# Patient Record
Sex: Male | Born: 2000 | Race: Black or African American | Hispanic: No | Smoking: Never smoker
Health system: Southern US, Community
[De-identification: ages and names within clinical notes are randomized; demographics above are authoritative.]

## PROBLEM LIST (undated history)

## (undated) HISTORY — PX: LEG AMPUTATION BELOW KNEE: SHX694

---
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
## 2020-08-05 ENCOUNTER — Other Ambulatory Visit: Payer: Medicaid Other

## 2020-08-05 ENCOUNTER — Other Ambulatory Visit: Payer: Self-pay

## 2020-08-05 ENCOUNTER — Encounter (HOSPITAL_BASED_OUTPATIENT_CLINIC_OR_DEPARTMENT_OTHER): Payer: Self-pay | Admitting: *Deleted

## 2020-08-05 ENCOUNTER — Emergency Department (HOSPITAL_BASED_OUTPATIENT_CLINIC_OR_DEPARTMENT_OTHER)
Admission: EM | Admit: 2020-08-05 | Discharge: 2020-08-05 | Disposition: A | Discharge status: Home / Self Care | Payer: Medicaid Other | Attending: Emergency Medicine | Admitting: Emergency Medicine

## 2020-08-05 DIAGNOSIS — Z91040 Latex allergy status: Secondary | ICD-10-CM | POA: Insufficient documentation

## 2020-08-05 DIAGNOSIS — J029 Acute pharyngitis, unspecified: Secondary | ICD-10-CM | POA: Diagnosis present

## 2020-08-05 DIAGNOSIS — Z20822 Contact with and (suspected) exposure to covid-19: Secondary | ICD-10-CM | POA: Diagnosis not present

## 2020-08-05 LAB — GROUP A STREP BY PCR: Group A Strep by PCR: NOT DETECTED

## 2020-08-05 MED ORDER — ONDANSETRON 4 MG PO TBDP: 4.0000 mg | ORAL_TABLET | Freq: Three times a day (TID) | ORAL | 0 refills | Status: DC | PRN

## 2020-08-05 MED ORDER — BENZONATATE 100 MG PO CAPS: 100.0000 mg | ORAL_CAPSULE | Freq: Three times a day (TID) | ORAL | 0 refills | Status: DC

## 2020-08-05 MED ORDER — FLUTICASONE PROPIONATE 50 MCG/ACT NA SUSP: 2.0000 | Freq: Every day | NASAL | 0 refills | Status: DC

## 2020-08-05 MED ORDER — PROMETHAZINE-DM 6.25-15 MG/5ML PO SYRP: 5.0000 mL | ORAL_SOLUTION | Freq: Four times a day (QID) | ORAL | 0 refills | Status: DC | PRN

## 2020-08-05 NOTE — ED Notes (Signed)
ED Provider at bedside. 

## 2020-08-05 NOTE — Discharge Instructions (Signed)
Your COVID test is pending; you should expect results within 24 hours. You can access your results on your MyChart--if you test positive you should receive a phone call.  In the meantime follow CDC guidelines and quarantine, wear a mask, wash hands often.   Please use Tylenol or ibuprofen for pain.  You may use 600 mg ibuprofen every 6 hours or 1000 mg of Tylenol every 6 hours.  You may choose to alternate between the 2.  This would be most effective.  Not to exceed 4 g of Tylenol within 24 hours.  Not to exceed 3200 mg ibuprofen 24 hours.   Please take over the counter vitamin D 2000-4000 units per day. I also recommend zinc 50 mg per day for the next two weeks.   Please return to ED if you feel have difficulty breathing or have emergent, new or concerning symptoms.  Patients who have symptoms consistent with COVID-19 should self isolated for: At least 3 days (72 hours) have passed since recovery, defined as resolution of fever without the use of fever reducing medications and improvement in respiratory symptoms (e.g., cough, shortness of breath), and At least 7 days have passed since symptoms first appeared.       Person Under Monitoring Name: William Koch  Location: [address] [place] [address]   Infection Prevention Recommendations for Individuals Confirmed to have, or Being Evaluated for, 2019 Novel Coronavirus (COVID-19) Infection Who Receive Care at Home  Individuals who are confirmed to have, or are being evaluated for, COVID-19 should follow the prevention steps below until a healthcare provider or local or state health department says they can return to normal activities.  Stay home except to get medical care You should restrict activities outside your home, except for getting medical care. Do not go to work, school, or public areas, and do not use public transportation or taxis.  Call ahead before visiting your doctor Before your medical appointment,  call the healthcare provider and tell them that you have, or are being evaluated for, COVID-19 infection. This will help the healthcare providers office take steps to keep other people from getting infected. Ask your healthcare provider to call the local or state health department.  Monitor your symptoms Seek prompt medical attention if your illness is worsening (e.g., difficulty breathing). Before going to your medical appointment, call the healthcare provider and tell them that you have, or are being evaluated for, COVID-19 infection. Ask your healthcare provider to call the local or state health department.  Wear a facemask You should wear a facemask that covers your nose and mouth when you are in the same room with other people and when you visit a healthcare provider. People who live with or visit you should also wear a facemask while they are in the same room with you.  Separate yourself from other people in your home As much as possible, you should stay in a different room from other people in your home. Also, you should use a separate bathroom, if available.  Avoid sharing household items You should not share dishes, drinking glasses, cups, eating utensils, towels, bedding, or other items with other people in your home. After using these items, you should wash them thoroughly with soap and water.  Cover your coughs and sneezes Cover your mouth and nose with a tissue when you cough or sneeze, or you can cough or sneeze into your sleeve. Throw used tissues in a lined trash can, and immediately wash your  hands with soap and water for at least 20 seconds or use an alcohol-based hand rub.  Wash your [employer] your hands often and thoroughly with soap and water for at least 20 seconds. You can use an alcohol-based hand sanitizer if soap and water are not available and if your hands are not visibly dirty. Avoid touching your eyes, nose, and mouth with unwashed hands.   Prevention  Steps for Caregivers and Household Members of Individuals Confirmed to have, or Being Evaluated for, COVID-19 Infection Being Cared for in the Home  If you live with, or provide care at home for, a person confirmed to have, or being evaluated for, COVID-19 infection please follow these guidelines to prevent infection:  Follow healthcare providers instructions Make sure that you understand and can help the patient follow any healthcare provider instructions for all care.  Provide for the patients basic needs You should help the patient with basic needs in the home and provide support for getting groceries, prescriptions, and other personal needs.  Monitor the patients symptoms If they are getting sicker, call his or her medical provider and tell them that the patient has, or is being evaluated for, COVID-19 infection. This will help the healthcare providers office take steps to keep other people from getting infected. Ask the healthcare provider to call the local or state health department.  Limit the number of people who have contact with the patient If possible, have only one caregiver for the patient. Other household members should stay in another home or place of residence. If this is not possible, they should stay in another room, or be separated from the patient as much as possible. Use a separate bathroom, if available. Restrict visitors who do not have an essential need to be in the home.  Keep older adults, very young children, and other sick people away from the patient Keep older adults, very young children, and those who have compromised immune systems or chronic health conditions away from the patient. This includes people with chronic heart, lung, or kidney conditions, diabetes, and cancer.  Ensure good ventilation Make sure that shared spaces in the home have good air flow, such as from an air conditioner or an opened window, weather permitting.  Wash your hands  often Wash your hands often and thoroughly with soap and water for at least 20 seconds. You can use an alcohol based hand sanitizer if soap and water are not available and if your hands are not visibly dirty. Avoid touching your eyes, nose, and mouth with unwashed hands. Use disposable paper towels to dry your hands. If not available, use dedicated cloth towels and replace them when they become wet.  Wear a facemask and gloves Wear a disposable facemask at all times in the room and gloves when you touch or have contact with the patients blood, body fluids, and/or secretions or excretions, such as sweat, saliva, sputum, nasal mucus, vomit, urine, or feces.  Ensure the mask fits over your nose and mouth tightly, and do not touch it during use. Throw out disposable facemasks and gloves after using them. Do not reuse. Wash your hands immediately after removing your facemask and gloves. If your personal clothing becomes contaminated, carefully remove clothing and launder. Wash your hands after handling contaminated clothing. Place all used disposable facemasks, gloves, and other waste in a lined container before disposing them with other household waste. Remove gloves and wash your hands immediately after handling these items.  Do not share dishes, glasses,  or other household items with the patient Avoid sharing household items. You should not share dishes, drinking glasses, cups, eating utensils, towels, bedding, or other items with a patient who is confirmed to have, or being evaluated for, COVID-19 infection. After the person uses these items, you should wash them thoroughly with soap and water.  Wash laundry thoroughly Immediately remove and wash clothes or bedding that have blood, body fluids, and/or secretions or excretions, such as sweat, saliva, sputum, nasal mucus, vomit, urine, or feces, on them. Wear gloves when handling laundry from the patient. Read and follow directions on labels of  laundry or clothing items and detergent. In general, wash and dry with the warmest temperatures recommended on the label.  Clean all areas the individual has used often Clean all touchable surfaces, such as counters, tabletops, doorknobs, bathroom fixtures, toilets, phones, keyboards, tablets, and bedside tables, every day. Also, clean any surfaces that may have blood, body fluids, and/or secretions or excretions on them. Wear gloves when cleaning surfaces the patient has come in contact with. Use a diluted bleach solution (e.g., dilute bleach with 1 part bleach and 10 parts water) or a household disinfectant with a label that says EPA-registered for coronaviruses. To make a bleach solution at home, add 1 tablespoon of bleach to 1 quart (4 cups) of water. For a larger supply, add  cup of bleach to 1 gallon (16 cups) of water. Read labels of cleaning products and follow recommendations provided on product labels. Labels contain instructions for safe and effective use of the cleaning product including precautions you should take when applying the product, such as wearing gloves or eye protection and making sure you have good ventilation during use of the product. Remove gloves and wash hands immediately after cleaning.  Monitor yourself for signs and symptoms of illness Caregivers and household members are considered close contacts, should monitor their health, and will be asked to limit movement outside of the home to the extent possible. Follow the monitoring steps for close contacts listed on the symptom monitoring form.   ? If you have additional questions, contact your local health department or call the epidemiologist on call at [phone] (available 24/7). ? This guidance is subject to change. For the most up-to-date guidance from [facility], please refer to their website: [employer]hu

## 2020-08-05 NOTE — ED Provider Notes (Signed)
[facility] EMERGENCY DEPARTMENT Provider Note   CSN: [id number] Arrival date & time: 08/05/20  1250     History Chief Complaint  Patient presents with  . Sore Throat    William Koch is a 20 y.o. male.  HPI  Patient is a 20 year old male presenting today with sore throat he states this is been ongoing for 1 week.  He states that he has a family member with strep throat.  He states he also has associated cough and congestion.  He denies any fevers or chills no other associated symptoms.  No aggravating symptoms.  He states he is otherwise feeling well.  No nausea vomiting chest pain or lightheadedness or dizziness.  No other associated symptoms.  He has taken no medications prior to arrival for symptoms.      History reviewed. No pertinent past medical history.  There are no problems to display for this patient.   Past Surgical History:  Procedure Laterality Date  . LEG AMPUTATION BELOW KNEE         Family History  Problem Relation Age of Onset  . Diabetes Father   . Hypertension Father   . Cancer Father   . Stroke Other     Social History   Tobacco Use  . Smoking status: Never Smoker  Substance Use Topics  . Alcohol use: No  . Drug use: No    Home Medications Prior to Admission medications   Medication Sig Start Date End Date Taking? Authorizing Provider  benzonatate (TESSALON) 100 MG capsule Take 1 capsule (100 mg total) by mouth every 8 (eight) hours. 08/05/20  Yes Chantale Leugers S, PA  fluticasone (FLONASE) 50 MCG/ACT nasal spray Place 2 sprays into both nostrils daily for 14 days. 08/05/20 08/19/20 Yes Latresa Gasser S, PA  ondansetron (ZOFRAN ODT) 4 MG disintegrating tablet Take 1 tablet (4 mg total) by mouth every 8 (eight) hours as needed for nausea or vomiting. 08/05/20  Yes Najeh Credit S, PA  promethazine-dextromethorphan (PROMETHAZINE-DM) 6.25-15 MG/5ML syrup Take 5 mLs by mouth 4 (four) times daily as needed for cough. 08/05/20  Yes  Fleta Borgeson, Stevphen Meuse S, PA  cefdinir (OMNICEF) 300 MG capsule Take 300 mg by mouth 2 (two) times daily. 10 day therapy course 04/28/15   [provider]    Allergies    Latex  Review of Systems   Review of Systems  Constitutional: Positive for fatigue. Negative for chills and fever.  HENT: Positive for congestion, postnasal drip, rhinorrhea and sore throat.   Eyes: Negative for pain.  Respiratory: Negative for cough and shortness of breath.   Cardiovascular: Negative for chest pain and leg swelling.  Gastrointestinal: Negative for abdominal pain, nausea and vomiting.  Genitourinary: Negative for dysuria.  Musculoskeletal: Negative for myalgias.  Skin: Negative for rash.  Neurological: Negative for dizziness and headaches.    Physical Exam Updated Vital Signs BP (!) 146/88 (BP Location: Right Arm)   Pulse 72   Temp 98.4 F (36.9 C) (Oral)   Resp 18   Ht 5\' 8"  (1.727 m)   Wt (!) 137.4 kg   SpO2 100%   BMI 46.07 kg/m   Physical Exam Vitals and nursing note reviewed.  Constitutional:      General: He is not in acute distress.    Appearance: He is obese.  HENT:     Head: Normocephalic and atraumatic.     Nose: Nose normal.     Mouth/Throat:     Comments: Posterior pharynx with cobblestoning  no tonsillar exudates or tonsillar hypertrophy.  Uvula is midline.  Normal phonation. Eyes:     General: No scleral icterus. Neck:     Comments: Mild bilateral anterior cervical chain lymphadenopathy.  Trachea midline. Cardiovascular:     Rate and Rhythm: Normal rate and regular rhythm.     Pulses: Normal pulses.     Heart sounds: Normal heart sounds.  Pulmonary:     Effort: Pulmonary effort is normal. No respiratory distress.     Breath sounds: No wheezing.  Abdominal:     Palpations: Abdomen is soft.     Tenderness: There is no abdominal tenderness.  Musculoskeletal:     Cervical back: Normal range of motion.     Right lower leg: No edema.     Left lower leg: No edema.   Skin:    General: Skin is warm and dry.     Capillary Refill: Capillary refill takes less than 2 seconds.  Neurological:     Mental Status: He is alert. Mental status is at baseline.  Psychiatric:        Mood and Affect: Mood normal.        Behavior: Behavior normal.     ED Results / Procedures / Treatments   Labs (all labs ordered are listed, but only abnormal results are displayed) Labs Reviewed  GROUP A STREP BY PCR    EKG None  Radiology No results found.  Procedures Procedures (including critical care time)  Medications Ordered in ED Medications - No data to display  ED Course  I have reviewed the triage vital signs and the nursing notes.  Pertinent labs & imaging results that were available during my care of the patient were reviewed by me and considered in my medical decision making (see chart for details).    MDM Rules/Calculators/A&P                          Patient is 20 year old male with past medical history detailed above presented today for sore throat for 1 week.  Strep test was negative.  I have low suspicion for bacterial pharyngitis.  Suspect viral pharyngitis.  With his other symptoms I have high suspicion for COVID-19.  He does have mildly elevated blood pressure no other significant abnormalities of his vital signs.  No shortness of breath or chest pain.  We will treat patient with Tylenol ibuprofen as well as cough medicine Flonase and Zofran.  I suspect that his postnasal drainage is what is causing his pharyngitis.  Given return precautions.  William Koch was evaluated in Emergency Department on 08/05/2020 for the symptoms described in the history of present illness. He was evaluated in the context of the global COVID-19 pandemic, which necessitated consideration that the patient might be at risk for infection with the SARS-CoV-2 virus that causes COVID-19. Institutional protocols and algorithms that pertain to the evaluation of patients at risk  for COVID-19 are in a state of rapid change based on information released by regulatory bodies including the CDC and federal and state organizations. These policies and algorithms were followed during the patient's care in the ED.  Final Clinical Impression(s) / ED Diagnoses Final diagnoses:  Pharyngitis, unspecified etiology  Contact with and (suspected) exposure to covid-19    Rx / DC Orders ED Discharge Orders         Ordered    benzonatate (TESSALON) 100 MG capsule  Every 8 hours  08/05/20 1752    fluticasone (FLONASE) 50 MCG/ACT nasal spray  Daily        08/05/20 1752    promethazine-dextromethorphan (PROMETHAZINE-DM) 6.25-15 MG/5ML syrup  4 times daily PRN        08/05/20 1752    ondansetron (ZOFRAN ODT) 4 MG disintegrating tablet  Every 8 hours PRN        08/05/20 1752           Gailen Shelter, [place] 08/05/20 1831    Tilden Fossa, MD 08/05/20 2114

## 2020-08-05 NOTE — ED Triage Notes (Signed)
C/o sore throat x 1 week , family at home with strep

## 2020-08-05 NOTE — ED Notes (Signed)
PA notified of B/P .  Recheck b/p 144/90

## 2020-08-08 LAB — NOVEL CORONAVIRUS, NAA: SARS-CoV-2, NAA: DETECTED — AB

## 2021-07-22 ENCOUNTER — Other Ambulatory Visit: Payer: Self-pay

## 2021-07-22 ENCOUNTER — Observation Stay (HOSPITAL_COMMUNITY)
Admission: EM | Admit: 2021-07-22 | Discharge: 2021-07-23 | Disposition: A | Discharge status: Home / Self Care | Payer: Medicaid Other | Attending: Family Medicine | Admitting: Family Medicine

## 2021-07-22 ENCOUNTER — Emergency Department (HOSPITAL_COMMUNITY)
Admission: EM | Admit: 2021-07-22 | Discharge: 2021-07-22 | Disposition: A | Discharge status: Home / Self Care | Payer: Medicaid Other | Source: Home / Self Care | Attending: Emergency Medicine | Admitting: Emergency Medicine

## 2021-07-22 ENCOUNTER — Encounter (HOSPITAL_COMMUNITY): Payer: Self-pay | Admitting: Emergency Medicine

## 2021-07-22 ENCOUNTER — Emergency Department (HOSPITAL_COMMUNITY): Payer: Medicaid Other

## 2021-07-22 DIAGNOSIS — Z20822 Contact with and (suspected) exposure to covid-19: Secondary | ICD-10-CM | POA: Insufficient documentation

## 2021-07-22 DIAGNOSIS — R569 Unspecified convulsions: Principal | ICD-10-CM | POA: Insufficient documentation

## 2021-07-22 DIAGNOSIS — Z91040 Latex allergy status: Secondary | ICD-10-CM | POA: Insufficient documentation

## 2021-07-22 DIAGNOSIS — Z79899 Other long term (current) drug therapy: Secondary | ICD-10-CM | POA: Insufficient documentation

## 2021-07-22 DIAGNOSIS — R6510 Systemic inflammatory response syndrome (SIRS) of non-infectious origin without acute organ dysfunction: Secondary | ICD-10-CM | POA: Insufficient documentation

## 2021-07-22 DIAGNOSIS — E669 Obesity, unspecified: Secondary | ICD-10-CM | POA: Diagnosis not present

## 2021-07-22 DIAGNOSIS — E872 Acidosis, unspecified: Secondary | ICD-10-CM | POA: Insufficient documentation

## 2021-07-22 DIAGNOSIS — Y900 Blood alcohol level of less than 20 mg/100 ml: Secondary | ICD-10-CM | POA: Diagnosis not present

## 2021-07-22 LAB — URINALYSIS, ROUTINE W REFLEX MICROSCOPIC
Bilirubin Urine: NEGATIVE
Glucose, UA: NEGATIVE mg/dL
Hgb urine dipstick: NEGATIVE
Ketones, ur: NEGATIVE mg/dL
Leukocytes,Ua: NEGATIVE
Nitrite: NEGATIVE
Protein, ur: NEGATIVE mg/dL
Specific Gravity, Urine: 1.013 (ref 1.005–1.030)
pH: 6 (ref 5.0–8.0)

## 2021-07-22 LAB — RAPID URINE DRUG SCREEN, HOSP PERFORMED
Amphetamines: NOT DETECTED
Barbiturates: NOT DETECTED
Benzodiazepines: NOT DETECTED
Cocaine: NOT DETECTED
Opiates: NOT DETECTED
Tetrahydrocannabinol: POSITIVE — AB

## 2021-07-22 LAB — CBC
HCT: 45.2 % (ref 39.0–52.0)
Hemoglobin: 14.5 g/dL (ref 13.0–17.0)
MCH: 29.4 pg (ref 26.0–34.0)
MCHC: 32.1 g/dL (ref 30.0–36.0)
MCV: 91.7 fL (ref 80.0–100.0)
Platelets: 381 10*3/uL (ref 150–400)
RBC: 4.93 MIL/uL (ref 4.22–5.81)
RDW: 13.2 % (ref 11.5–15.5)
WBC: 11.9 10*3/uL — ABNORMAL HIGH (ref 4.0–10.5)
nRBC: 0 % (ref 0.0–0.2)

## 2021-07-22 LAB — CBC WITH DIFFERENTIAL/PLATELET
Abs Immature Granulocytes: 0.19 10*3/uL — ABNORMAL HIGH (ref 0.00–0.07)
Basophils Absolute: 0.1 10*3/uL (ref 0.0–0.1)
Basophils Relative: 0 %
Eosinophils Absolute: 0 10*3/uL (ref 0.0–0.5)
Eosinophils Relative: 0 %
HCT: 48.8 % (ref 39.0–52.0)
Hemoglobin: 15.3 g/dL (ref 13.0–17.0)
Immature Granulocytes: 1 %
Lymphocytes Relative: 16 %
Lymphs Abs: 4.9 10*3/uL — ABNORMAL HIGH (ref 0.7–4.0)
MCH: 29.9 pg (ref 26.0–34.0)
MCHC: 31.4 g/dL (ref 30.0–36.0)
MCV: 95.3 fL (ref 80.0–100.0)
Monocytes Absolute: 1.9 10*3/uL — ABNORMAL HIGH (ref 0.1–1.0)
Monocytes Relative: 7 %
Neutro Abs: 22.8 10*3/uL — ABNORMAL HIGH (ref 1.7–7.7)
Neutrophils Relative %: 76 %
Platelets: 466 10*3/uL — ABNORMAL HIGH (ref 150–400)
RBC: 5.12 MIL/uL (ref 4.22–5.81)
RDW: 13.2 % (ref 11.5–15.5)
WBC: 29.9 10*3/uL — ABNORMAL HIGH (ref 4.0–10.5)
nRBC: 0 % (ref 0.0–0.2)

## 2021-07-22 LAB — BASIC METABOLIC PANEL
Anion gap: 10 (ref 5–15)
Anion gap: 22 — ABNORMAL HIGH (ref 5–15)
BUN: 12 mg/dL (ref 6–20)
BUN: 11 mg/dL (ref 6–20)
CO2: 22 mmol/L (ref 22–32)
CO2: 11 mmol/L — ABNORMAL LOW (ref 22–32)
Calcium: 9.2 mg/dL (ref 8.9–10.3)
Calcium: 9.6 mg/dL (ref 8.9–10.3)
Chloride: 106 mmol/L (ref 98–111)
Chloride: 109 mmol/L (ref 98–111)
Creatinine, Ser: 0.95 mg/dL (ref 0.61–1.24)
Creatinine, Ser: 1.18 mg/dL (ref 0.61–1.24)
GFR, Estimated: >60 mL/min (ref >60)
GFR, Estimated: >60 mL/min (ref >60)
Glucose, Bld: 114 mg/dL — ABNORMAL HIGH (ref 70–99)
Glucose, Bld: 117 mg/dL — ABNORMAL HIGH (ref 70–99)
Potassium: 4.8 mmol/L (ref 3.5–5.1)
Potassium: 4.5 mmol/L (ref 3.5–5.1)
Sodium: 138 mmol/L (ref 135–145)
Sodium: 142 mmol/L (ref 135–145)

## 2021-07-22 LAB — CBG MONITORING, ED
Glucose-Capillary: 111 mg/dL — ABNORMAL HIGH (ref 70–99)
Glucose-Capillary: 84 mg/dL (ref 70–99)

## 2021-07-22 LAB — RESP PANEL BY RT-PCR (FLU A&B, COVID) ARPGX2
Influenza A by PCR: NEGATIVE
Influenza B by PCR: NEGATIVE
SARS Coronavirus 2 by RT PCR: NEGATIVE

## 2021-07-22 LAB — ETHANOL: Alcohol, Ethyl (B): <10 mg/dL (ref <10)

## 2021-07-22 MED ORDER — LEVETIRACETAM IN NACL 1000 MG/100ML IV SOLN
1000.0000 mg | INTRAVENOUS | Status: AC
  Administered 2021-07-22 (×2): 1000 mg via INTRAVENOUS
  Filled 2021-07-22 (×2): qty 100

## 2021-07-22 MED ORDER — LORAZEPAM 2 MG/ML IJ SOLN
1.0000 mg | Freq: Once | INTRAMUSCULAR | Status: AC
  Administered 2021-07-22: 23:00:00 1 mg via INTRAVENOUS
  Filled 2021-07-22: qty 1

## 2021-07-22 MED ORDER — SODIUM CHLORIDE 0.9 % IV SOLN: 2000.0000 mg | Freq: Once | INTRAVENOUS | Status: DC

## 2021-07-22 NOTE — ED Provider Notes (Signed)
Emergency Medicine Provider Triage Evaluation Note  William Koch , a 20 y.o. male  was evaluated in triage.  Pt complains of seizure onset prior to arrival.  Patient had an MSE and full evaluation today for similar concerns with negative work-up.  Patient notes that he went to lay in the tub and when he woke up EMS and his girlfriend were around him.  He has associated headache.  He tried Tylenol for his symptoms.  Denies chest pain, shortness of breath, abdominal pain, nausea, vomiting.  Review of Systems  Positive: Headache Negative: Chest pain, shortness of breath  Physical Exam  BP 118/73    Pulse 87    Temp 99 F (37.2 C)    Resp 16    SpO2 99%  Gen:   Awake, no distress   Resp:  Normal effort MSK:   Moves extremities without difficulty Other:  No abdominal tenderness to palpation.  No chest wall tenderness to palpation. EOMI. PERRL.  Medical Decision Making  Medically screening exam initiated at 8:43 PM.  Appropriate orders placed.  Prabhjot Piscitello was informed that the remainder of the evaluation will be completed by another provider, this initial triage assessment does not replace that evaluation, and the importance of remaining in the ED until their evaluation is complete.   Donnalynn Wheeless A, PA-C 07/22/21 2047    Gilda Crease, MD 07/22/21 2222

## 2021-07-22 NOTE — ED Provider Notes (Signed)
[facility] EMERGENCY DEPARTMENT Provider Note   CSN: [id number] Arrival date & time: 07/22/21  1245     History Chief Complaint  Patient presents with   Seizures    William Koch is a 20 y.o. male who presents emergency department for seizure prior to arrival.  Seizure was witnessed by girlfriend, who states that patient woke up and had full body convulsions for about 3 minutes.  She notes that for several minutes after that, she had difficulty waking up the patient.  Once EMS arrived the patient appeared to be confused.  He states that his tongue is sore, and he thinks that he bit it during the seizure.  He does not remember the events prior to the onset of the seizure.  He states he has been feeling well the past couple days, but does feel dehydrated at the moment.  He denies any history of seizures.   Seizures     History reviewed. No pertinent past medical history.  There are no problems to display for this patient.   Past Surgical History:  Procedure Laterality Date   LEG AMPUTATION BELOW KNEE         Family History  Problem Relation Age of Onset   Diabetes Father    Hypertension Father    Cancer Father    Stroke Other     Social History   Tobacco Use   Smoking status: Never  Substance Use Topics   Alcohol use: No   Drug use: No    Home Medications Prior to Admission medications   Medication Sig Start Date End Date Taking? Authorizing Provider  benzonatate (TESSALON) 100 MG capsule Take 1 capsule (100 mg total) by mouth every 8 (eight) hours. Patient not taking: Reported on 07/22/2021 08/05/20   Gailen Shelter, PA  cefdinir (OMNICEF) 300 MG capsule Take 300 mg by mouth 2 (two) times daily. 10 day therapy course Patient not taking: Reported on 07/22/2021 04/28/15   [provider]  fluticasone (FLONASE) 50 MCG/ACT nasal spray Place 2 sprays into both nostrils daily for 14 days. Patient not taking: Reported on 07/22/2021 08/05/20  07/22/21  Gailen Shelter, PA  ondansetron (ZOFRAN ODT) 4 MG disintegrating tablet Take 1 tablet (4 mg total) by mouth every 8 (eight) hours as needed for nausea or vomiting. Patient not taking: Reported on 07/22/2021 08/05/20   Gailen Shelter, PA  promethazine-dextromethorphan (PROMETHAZINE-DM) 6.25-15 MG/5ML syrup Take 5 mLs by mouth 4 (four) times daily as needed for cough. Patient not taking: Reported on 07/22/2021 08/05/20   Gailen Shelter, PA    Allergies    Latex  Review of Systems   Review of Systems  Constitutional:  Negative for chills and fever.  HENT:  Negative for congestion.   Respiratory:  Negative for cough and shortness of breath.   Cardiovascular:  Negative for chest pain.  Gastrointestinal:  Negative for abdominal pain, constipation, diarrhea, nausea and vomiting.  Neurological:  Positive for seizures and headaches. Negative for dizziness, speech difficulty, weakness, light-headedness and numbness.  All other systems reviewed and are negative.  Physical Exam Updated Vital Signs BP (!) 144/77    Pulse 83    Temp 98.8 F (37.1 C) (Oral)    Resp 15    SpO2 100%   Physical Exam Vitals and nursing note reviewed.  Constitutional:      Appearance: Normal appearance.  HENT:     Head: Normocephalic and atraumatic.  Eyes:     Conjunctiva/sclera:  Conjunctivae normal.  Cardiovascular:     Rate and Rhythm: Normal rate and regular rhythm.  Pulmonary:     Effort: Pulmonary effort is normal. No respiratory distress.     Breath sounds: Normal breath sounds.  Abdominal:     General: There is no distension.     Palpations: Abdomen is soft.     Tenderness: There is no abdominal tenderness.  Musculoskeletal:     Comments: S/p right BKA since childhood  Skin:    General: Skin is warm and dry.  Neurological:     General: No focal deficit present.     Mental Status: He is alert.     Comments: Neuro: Speech is clear, able to follow commands. CN III-XII intact grossly  intact. PERRLA. EOMI. Sensation intact throughout. Str 5/5 all extremities.     ED Results / Procedures / Treatments   Labs (all labs ordered are listed, but only abnormal results are displayed) Labs Reviewed  BASIC METABOLIC PANEL - Abnormal; Notable for the following components:      Result Value   Glucose, Bld 114 (*)    All other components within normal limits  CBC - Abnormal; Notable for the following components:   WBC 11.9 (*)    All other components within normal limits  URINALYSIS, ROUTINE W REFLEX MICROSCOPIC - Abnormal; Notable for the following components:   Color, Urine STRAW (*)    All other components within normal limits  RAPID URINE DRUG SCREEN, HOSP PERFORMED - Abnormal; Notable for the following components:   Tetrahydrocannabinol POSITIVE (*)    All other components within normal limits  CBG MONITORING, ED - Abnormal; Notable for the following components:   Glucose-Capillary 111 (*)    All other components within normal limits  RESP PANEL BY RT-PCR (FLU A&B, COVID) [id number]    EKG EKG Interpretation  Date/Time:  Thursday July 22 2021 12:45:57 EST Ventricular Rate:  110 PR Interval:  138 QRS Duration: 96 QT Interval:  326 QTC Calculation: 441 R Axis:   47 Text Interpretation: Sinus tachycardia Otherwise normal ECG When compared with ECG of 12-May-2015 22:39, PREVIOUS ECG IS PRESENT Confirmed by Alvester Chou [phone]) on 07/22/2021 3:36:22 PM  Radiology CT HEAD WO CONTRAST  Result Date: 07/22/2021 CLINICAL DATA:  New onset seizures.  No trauma. EXAM: CT HEAD WITHOUT CONTRAST TECHNIQUE: Contiguous axial images were obtained from the base of the skull through the vertex without intravenous contrast. COMPARISON:  None. FINDINGS: Brain: No evidence of acute infarction, hemorrhage, hydrocephalus, extra-axial collection or mass lesion/mass effect. Vascular: No hyperdense vessel or unexpected calcification. Skull: Calvarium appears intact. Sinuses/Orbits:  Paranasal sinuses and mastoid air cells are clear. Other: None. IMPRESSION: No acute intracranial abnormalities. Electronically Signed   By: Burman Nieves M.D.   On: 07/22/2021 16:31    Procedures Procedures   Medications Ordered in ED Medications - No data to display  ED Course  I have reviewed the triage vital signs and the nursing notes.  Pertinent labs & imaging results that were available during my care of the patient were reviewed by me and considered in my medical decision making (see chart for details).    MDM Rules/Calculators/A&P                          Patient is an otherwise healthy 20 year old male presents the emergency department after a first-time seizure prior to arrival. Seizure lasted about 3 minutes and involved full body convulsions. Had time  after seizure where they had difficulty waking him up and he was more confused. Patient used marijuana before going to bed, no alcohol and no other drug use.   On my exam patient is afebrile, not tachycardic, not hypoxic, and in no acute distress.  He is not toxic appearing and has no infectious symptoms.  Patient with no evidence of focal neuro deficits on physical exam and is at mental baseline.  Labs and imaging have been reviewed.  Patient is advised to followup with neurologist in regards to today's event.  Spoke with patient and family in detail about driving restrictions until cleared by a neurologist.  Patient verbalizes understanding.  Answered all questions.  Patient is hemodynamically stable and in no acute distress prior to discharge.   I discussed this case with my attending physician Dr. Renaye Rakers who cosigned this note including patient's presenting symptoms, physical exam, and planned diagnostics and interventions. Attending physician stated agreement with plan or made changes to plan which were implemented.    Final Clinical Impression(s) / ED Diagnoses Final diagnoses:  Seizure (HCC)    Rx / DC Orders ED  Discharge Orders          Ordered    Ambulatory referral to Neurology       Comments: An appointment is requested in approximately: 1 week for new onset seizure   07/22/21 1656           Portions of this report may have been transcribed using voice recognition software. Every effort was made to ensure accuracy; however, inadvertent computerized transcription errors may be present.    Jeanella Flattery 07/22/21 1724    Terald Sleeper, MD 07/22/21 1740

## 2021-07-22 NOTE — ED Notes (Signed)
Pt had seizure while in lobby, incontinent of urine. Pt brought to treatment room, he is post ictal, vss. Iv established. Mother at bedside.

## 2021-07-22 NOTE — ED Provider Notes (Signed)
Emergency Medicine Provider Triage Evaluation Note  William Koch , a 20 y.o. male  was evaluated in triage.  Brought into the ED by EMS complaining of seizure onset prior to arrival.  Seizure was witnessed by family per EMS.  No prior history of seizures.  Patient notes that his tongue is sore and thinks that he may have scraped it during seizure.  He does not remember the events prior to the onset of the seizure.  Currently he feels dehydrated and "like crap."  Patient is not somnolent.  He has not tried medications for his symptoms.  Denies history of diabetes or hypertension.  Denies chest pain, shortness of breath, abdominal pain, nausea, vomiting, fever, chills.   Review of Systems  Positive: Seizure Negative: Nausea, vomiting  Physical Exam  BP 134/72 (BP Location: Left Arm)    Pulse (!) 109    Temp 98.8 F (37.1 C) (Oral)    Resp 18    SpO2 97%  Gen:   Awake, no distress   Resp:  Normal effort  MSK:   Moves extremities without difficulty  Other:  PERRL. EOMI.  Medical Decision Making  Medically screening exam initiated at 1:10 PM.  Appropriate orders placed.  Dereke Neumann was informed that the remainder of the evaluation will be completed by another provider, this initial triage assessment does not replace that evaluation, and the importance of remaining in the ED until their evaluation is complete.     Judia Arnott A, PA-C 07/22/21 1314    Terald Sleeper, MD 07/22/21 [phone]

## 2021-07-22 NOTE — ED Notes (Signed)
Patient verbalizes understanding of discharge instructions. Follow-up care reviewed. Opportunity for questioning and answers were provided. Armband removed by staff, pt discharged from ED ambulatory.  

## 2021-07-22 NOTE — ED Triage Notes (Addendum)
Pt to triage via [facility] from home.  Witnessed seizure today by family.  No prior history of seizures.  Family reports full body convulsions.  Postictal on EMS arrival.  + urinary incontinence and bit his tongue.  Pt states that he doesn't remember waking up this morning. Denies any recent symptoms or trauma.  States he feels "dehydrated".  EMS- 18g LAC

## 2021-07-22 NOTE — Discharge Instructions (Addendum)
You were seen in the emergency department today for seizure.  As we discussed your lab work and your imaging has looked normal today.  We did not find an acute cause for your seizures.  Like we discussed you can follow-up the results of your COVID/flu testing on MyChart.  I am referring you to a neurologist.  I'm not sure if they take your insurance, I would ask when you call.  If they do not call you by the end of the week, I have given you their contact information for you to call.  Per [facility] statutes, patients with seizures are not allowed to drive until  they have been seizure-free for six months (or cleared by neurologist).  Use caution when using heavy equipment or power tools. Avoid working on ladders or at heights. Take showers instead of baths. Ensure the water temperature is not too high on the home water heater. Do not go swimming alone. When caring for infants or small children, sit down when holding, feeding, or changing them to minimize risk of injury to the child in the event you have a seizure.  Also, maintain good sleep hygiene. Avoid alcohol.   Continue to monitor how you're doing and return to the ER for new or worsening symptoms such as additional seizures.   It has been a pleasure seeing and caring for you today and I hope you start feeling better soon!

## 2021-07-22 NOTE — ED Triage Notes (Signed)
Patient arrived with EMS from home reports seizure x2 today , seen here this morning for the same complaint, no injury /alert and oriented , respirations unlabored.

## 2021-07-22 NOTE — ED Provider Notes (Signed)
[facility] Provider Note   CSN: [id number] Arrival date & time: 07/22/21  2003     History Chief Complaint  Patient presents with   Seizures    [facility] is a 20 y.o. male.  Patient brought to the emergency department for evaluation of seizure.  Patient was seen in this emergency department earlier today for first-time seizure.  Work-up was negative and he was discharged.  Patient then had another generalized tonic-clonic seizure at home.  He was brought back to the emergency department.  He had a witnessed seizure in triage and was brought straight back to exam room.  Level 5 caveat due to acuity.      History reviewed. No pertinent past medical history.  There are no problems to display for this patient.   Past Surgical History:  Procedure Laterality Date   LEG AMPUTATION BELOW KNEE         Family History  Problem Relation Age of Onset   Diabetes Father    Hypertension Father    Cancer Father    Stroke Other     Social History   Tobacco Use   Smoking status: Never  Substance Use Topics   Alcohol use: No   Drug use: No    Home Medications Prior to Admission medications   Medication Sig Start Date End Date Taking? Authorizing Provider  acetaminophen (TYLENOL) 500 MG tablet Take 500 mg by mouth every 6 (six) hours as needed for headache.   Yes [provider]  benzonatate (TESSALON) 100 MG capsule Take 1 capsule (100 mg total) by mouth every 8 (eight) hours. Patient not taking: Reported on 07/22/2021 08/05/20   Gailen Shelter, PA  cefdinir (OMNICEF) 300 MG capsule Take 300 mg by mouth 2 (two) times daily. 10 day therapy course Patient not taking: Reported on 07/22/2021 04/28/15   [provider]  fluticasone (FLONASE) 50 MCG/ACT nasal spray Place 2 sprays into both nostrils daily for 14 days. Patient not taking: Reported on 07/22/2021 08/05/20 07/22/21  Gailen Shelter, PA  ondansetron (ZOFRAN  ODT) 4 MG disintegrating tablet Take 1 tablet (4 mg total) by mouth every 8 (eight) hours as needed for nausea or vomiting. Patient not taking: Reported on 07/22/2021 08/05/20   Gailen Shelter, PA  promethazine-dextromethorphan (PROMETHAZINE-DM) 6.25-15 MG/5ML syrup Take 5 mLs by mouth 4 (four) times daily as needed for cough. Patient not taking: Reported on 07/22/2021 08/05/20   Gailen Shelter, PA    Allergies    Latex  Review of Systems   Review of Systems  Unable to perform ROS: Acuity of condition   Physical Exam Updated Vital Signs BP (!) 102/42    Pulse 97    Temp 99 F (37.2 C)    Resp (!) 25    SpO2 (!) 88%   Physical Exam Vitals and nursing note reviewed.  Constitutional:      General: He is in acute distress (Snoring respirations).     Appearance: Normal appearance. He is well-developed. He is diaphoretic.  HENT:     Head: Normocephalic and atraumatic.     Right Ear: Hearing normal.     Left Ear: Hearing normal.     Nose: Nose normal.  Eyes:     Conjunctiva/sclera: Conjunctivae normal.     Pupils: Pupils are equal, round, and reactive to light.  Cardiovascular:     Rate and Rhythm: Regular rhythm.     Heart sounds: S1 normal and S2  normal. No murmur heard.   No friction rub. No gallop.  Pulmonary:     Effort: Pulmonary effort is normal. No respiratory distress.     Breath sounds: Normal breath sounds.     Comments: snoring Chest:     Chest wall: No tenderness.  Abdominal:     General: Bowel sounds are normal.     Palpations: Abdomen is soft.     Tenderness: There is no abdominal tenderness. There is no guarding or rebound. Negative signs include Murphy's sign and McBurney's sign.     Hernia: No hernia is present.  Musculoskeletal:        General: Normal range of motion.     Cervical back: Normal range of motion and neck supple.  Skin:    General: Skin is warm.     Findings: No rash.  Neurological:     Mental Status: He is unresponsive.  Psychiatric:         Speech: He is noncommunicative.    ED Results / Procedures / Treatments   Labs (all labs ordered are listed, but only abnormal results are displayed) Labs Reviewed  CBC WITH DIFFERENTIAL/PLATELET - Abnormal; Notable for the following components:      Result Value   WBC 29.9 (*)    Platelets 466 (*)    Neutro Abs 22.8 (*)    Lymphs Abs 4.9 (*)    Monocytes Absolute 1.9 (*)    Abs Immature Granulocytes 0.19 (*)    All other components within normal limits  BASIC METABOLIC PANEL - Abnormal; Notable for the following components:   CO2 11 (*)    Glucose, Bld 117 (*)    Anion gap 22 (*)    All other components within normal limits  ETHANOL  CBG MONITORING, ED    EKG EKG Interpretation  Date/Time:  Thursday July 22 2021 22:51:15 EST Ventricular Rate:  100 PR Interval:  135 QRS Duration: 103 QT Interval:  352 QTC Calculation: 454 R Axis:   64 Text Interpretation: Sinus tachycardia Borderline Q waves in inferior leads ST elev, probable normal early repol pattern Confirmed by Gilda Crease [id number]) on 07/22/2021 11:29:46 PM  Radiology CT HEAD WO CONTRAST  Result Date: 07/22/2021 CLINICAL DATA:  New onset seizures.  No trauma. EXAM: CT HEAD WITHOUT CONTRAST TECHNIQUE: Contiguous axial images were obtained from the base of the skull through the vertex without intravenous contrast. COMPARISON:  None. FINDINGS: Brain: No evidence of acute infarction, hemorrhage, hydrocephalus, extra-axial collection or mass lesion/mass effect. Vascular: No hyperdense vessel or unexpected calcification. Skull: Calvarium appears intact. Sinuses/Orbits: Paranasal sinuses and mastoid air cells are clear. Other: None. IMPRESSION: No acute intracranial abnormalities. Electronically Signed   By: Burman Nieves M.D.   On: 07/22/2021 16:31    Procedures Procedures   Medications Ordered in ED Medications  levETIRAcetam (KEPPRA) IVPB 1000 mg/100 mL premix (1,000 mg Intravenous New Bag/Given  07/22/21 2328)  LORazepam (ATIVAN) injection 1 mg (1 mg Intravenous Given 07/22/21 2301)    ED Course  I have reviewed the triage vital signs and the nursing notes.  Pertinent labs & imaging results that were available during my care of the patient were reviewed by me and considered in my medical decision making (see chart for details).    MDM Rules/Calculators/A&P                         Patient returns to emergency department for recurrent seizure.  Patient had a  first-time seizure this morning, was worked up and discharged.  He had a second seizure at home and was brought to the emergency department.  While in triage he has had a third seizure.  Upon my evaluation he is postictal.  Patient was incontinent of urine and was witnessed to have generalized tonic-clonic seizure.  Work-up from earlier reviewed.  Drug screen only positive for THC.  CT head unremarkable.  Basic labs repeated here, no new findings.  Patient administered Ativan and IV Keppra.  He was postictal after his most recent seizure, is now becoming more alert.  Will admit for further work-up.     Final Clinical Impression(s) / ED Diagnoses Final diagnoses:  Seizure [facility])    Rx / DC Orders ED Discharge Orders     None        Gilda Crease, MD 07/22/21 2333

## 2021-07-23 ENCOUNTER — Observation Stay (HOSPITAL_COMMUNITY): Payer: Medicaid Other

## 2021-07-23 ENCOUNTER — Encounter (HOSPITAL_COMMUNITY): Payer: Self-pay | Admitting: Family Medicine

## 2021-07-23 DIAGNOSIS — R569 Unspecified convulsions: Principal | ICD-10-CM

## 2021-07-23 LAB — COMPREHENSIVE METABOLIC PANEL
ALT: 43 U/L (ref 0–44)
AST: 30 U/L (ref 15–41)
Albumin: 3.9 g/dL (ref 3.5–5.0)
Alkaline Phosphatase: 77 U/L (ref 38–126)
Anion gap: 7 (ref 5–15)
BUN: 10 mg/dL (ref 6–20)
CO2: 20 mmol/L — ABNORMAL LOW (ref 22–32)
Calcium: 8.7 mg/dL — ABNORMAL LOW (ref 8.9–10.3)
Chloride: 109 mmol/L (ref 98–111)
Creatinine, Ser: 1.04 mg/dL (ref 0.61–1.24)
GFR, Estimated: >60 mL/min (ref >60)
Glucose, Bld: 87 mg/dL (ref 70–99)
Potassium: 3.5 mmol/L (ref 3.5–5.1)
Sodium: 136 mmol/L (ref 135–145)
Total Bilirubin: 0.7 mg/dL (ref 0.3–1.2)
Total Protein: 7.1 g/dL (ref 6.5–8.1)

## 2021-07-23 LAB — CBC
HCT: 40.8 % (ref 39.0–52.0)
Hemoglobin: 13.6 g/dL (ref 13.0–17.0)
MCH: 29.7 pg (ref 26.0–34.0)
MCHC: 33.3 g/dL (ref 30.0–36.0)
MCV: 89.1 fL (ref 80.0–100.0)
Platelets: 368 K/uL (ref 150–400)
RBC: 4.58 MIL/uL (ref 4.22–5.81)
RDW: 13.2 % (ref 11.5–15.5)
WBC: 18 K/uL — ABNORMAL HIGH (ref 4.0–10.5)
nRBC: 0 % (ref 0.0–0.2)

## 2021-07-23 LAB — MAGNESIUM: Magnesium: 2.8 mg/dL — ABNORMAL HIGH (ref 1.7–2.4)

## 2021-07-23 LAB — PHOSPHORUS: Phosphorus: 4.8 mg/dL — ABNORMAL HIGH (ref 2.5–4.6)

## 2021-07-23 LAB — HIV ANTIBODY (ROUTINE TESTING W REFLEX): HIV Screen 4th Generation wRfx: NONREACTIVE

## 2021-07-23 MED ORDER — SENNOSIDES-DOCUSATE SODIUM 8.6-50 MG PO TABS: 1.0000 | ORAL_TABLET | Freq: Every evening | ORAL | Status: DC | PRN

## 2021-07-23 MED ORDER — SODIUM CHLORIDE 0.9 % IV SOLN: INTRAVENOUS | Status: AC

## 2021-07-23 MED ORDER — LEVETIRACETAM IN NACL 500 MG/100ML IV SOLN
500.0000 mg | Freq: Two times a day (BID) | INTRAVENOUS | Status: DC
  Administered 2021-07-23: 10:00:00 500 mg via INTRAVENOUS
  Filled 2021-07-23 (×3): qty 100

## 2021-07-23 MED ORDER — SODIUM CHLORIDE 0.9% FLUSH
3.0000 mL | Freq: Two times a day (BID) | INTRAVENOUS | Status: DC
  Administered 2021-07-23: 02:00:00 3 mL via INTRAVENOUS

## 2021-07-23 MED ORDER — ONDANSETRON HCL 4 MG PO TABS: 4.0000 mg | ORAL_TABLET | Freq: Four times a day (QID) | ORAL | Status: DC | PRN

## 2021-07-23 MED ORDER — ACETAMINOPHEN 650 MG RE SUPP: 650.0000 mg | Freq: Four times a day (QID) | RECTAL | Status: DC | PRN

## 2021-07-23 MED ORDER — ACETAMINOPHEN 325 MG PO TABS: 650.0000 mg | ORAL_TABLET | Freq: Four times a day (QID) | ORAL | Status: DC | PRN

## 2021-07-23 MED ORDER — ENOXAPARIN SODIUM 40 MG/0.4ML IJ SOSY
40.0000 mg | PREFILLED_SYRINGE | Freq: Every day | INTRAMUSCULAR | Status: DC
  Administered 2021-07-23: 10:00:00 40 mg via SUBCUTANEOUS
  Filled 2021-07-23: qty 0.4

## 2021-07-23 MED ORDER — ONDANSETRON HCL 4 MG/2ML IJ SOLN: 4.0000 mg | Freq: Four times a day (QID) | INTRAMUSCULAR | Status: DC | PRN

## 2021-07-23 MED ORDER — LEVETIRACETAM 500 MG PO TABS: 500.0000 mg | ORAL_TABLET | Freq: Two times a day (BID) | ORAL | 1 refills | Status: DC

## 2021-07-23 NOTE — Progress Notes (Signed)
EEG done at bedside. No skin breakdown noted. Results pending. 

## 2021-07-23 NOTE — Procedures (Signed)
Patient Name: [facility]  MRN: [id number]  Epilepsy Attending: Charlsie Quest  Referring Physician/Provider: Dr Odie Sera Date: 07/23/2021 Duration: 23.08 mins  Patient history: 20 year old male with witnessed generalized seizure.  EEG to evaluate for seizure.   Level of alertness: Awake, asleep  AEDs during EEG study: LEV  Technical aspects: This EEG study was done with scalp electrodes positioned according to the 10-20 International system of electrode placement. Electrical activity was acquired at a sampling rate of 500Hz  and reviewed with a high frequency filter of 70Hz  and a low frequency filter of 1Hz . EEG data were recorded continuously and digitally stored.   Description: The posterior dominant rhythm consists of 9-10 Hz activity of moderate voltage (25-35 uV) seen predominantly in posterior head regions, symmetric and reactive to eye opening and eye closing. Sleep was characterized by vertex waves, sleep spindles (12 to 14 Hz), maximal frontocentral region.  Hyperventilation and photic stimulation were not performed.   Of note, EEG was technically difficult due to EKG artifact.  IMPRESSION: This study is within normal limits. No seizures or epileptiform discharges were seen throughout the recording.  [name] 

## 2021-07-23 NOTE — ED Notes (Signed)
Pt ambulatory to waiting room. Pt verbalized understanding of discharge instructions.   

## 2021-07-23 NOTE — Discharge Summary (Signed)
Physician Discharge Summary  [facility] FTD:[id number] DOB: [birth year] DOA: 07/22/2021  PCP: System, Provider Not In  Admit date: 07/22/2021 Discharge date: 07/23/2021  Admitted From: home Disposition:  home  Recommendations for Outpatient Follow-up:  Follow up with PCP in 1-2 weeks Follow-up with neurology in 3 to 4 weeks  Home Health: none Equipment/Devices: none  Discharge Condition: stable CODE STATUS: Full code Diet recommendation: regular  HPI: Per admitting MD, [facility] is a pleasant 20 y.o. 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  Patient was in his usual state of health, woke to use the bathroom yesterday morning, returned to bed, and then had generalized convulsions witnessed by his girlfriend.  Patient's mother was called to the room and she also witnessed generalized shaking for 3 to 5 minutes.  Patient was then snoring loudly and difficult to wake for a couple minutes, and this was followed by lethargy and confusion.  He was brought into the ED where he had unremarkable basic blood work, no acute findings on head CT, returned to his usual state, and was discharged home.  Shortly after returning home, patient had a second generalized seizure witnessed by his girlfriend while he was taking a bath and texting on his cell phone.  He was then brought into the emergency department with EMS where he fell asleep in triage and had a third generalized seizure while sleeping.  He bit his tongue and had urinary incontinence.  Patient complains of generalized headache, but no other complaints and specifically denied any recent fevers or chills.  He denied alcohol or illicit substance use.  There is a family history of seizures in his paternal grandmother and aunt.  Hospital Course / Discharge diagnoses: Principal problem Seizures-patient was admitted to the hospital  with 3 episodes of seizures, 2 at home as well as 1 in the triage.  Neurology consulted and followed patient while hospitalized.  CT scan of the head was without acute findings.  MRI unremarkable as well.  He underwent an EEG which did not show any epileptiform discharges.  There is no history of substance abuse or prior seizures, however he does have a family history of seizures.  He was placed on Keppra with improvement, has returned to baseline, and per neurology can be discharged home.  He will have outpatient follow-up with neurology.  Active problems Leukocytosis-likely reactive, improving.  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        Medication List     TAKE these medications    acetaminophen 500 MG tablet Commonly known as: TYLENOL Take 500 mg by mouth every 6 (six) hours as needed for headache.   fluticasone 50 MCG/ACT nasal spray Commonly known as: FLONASE Place 2 sprays into both nostrils daily for 14 days.   levETIRAcetam 500 MG tablet Commonly known as: Keppra Take 1 tablet (500 mg total) by mouth 2 (two) times daily.         Consultations: Neurology   Procedures/Studies:  CT HEAD WO CONTRAST  Result Date: 07/22/2021 CLINICAL DATA:  New onset seizures.  No trauma. EXAM: CT HEAD WITHOUT CONTRAST TECHNIQUE: Contiguous axial images were obtained from the base  of the skull through the vertex without intravenous contrast. COMPARISON:  None. FINDINGS: Brain: No evidence of acute infarction, hemorrhage, hydrocephalus, extra-axial collection or mass lesion/mass effect. Vascular: No hyperdense vessel or unexpected calcification. Skull: Calvarium appears intact. Sinuses/Orbits: Paranasal sinuses and mastoid air cells are clear. Other: None. IMPRESSION:  No acute intracranial abnormalities. Electronically Signed   By: Burman Nieves M.D.   On: 07/22/2021 16:31   MR BRAIN WO CONTRAST  Result Date: 07/23/2021 CLINICAL DATA:  Atraumatic seizure, new onset. EXAM: MRI HEAD WITHOUT CONTRAST TECHNIQUE: Multiplanar, multiecho pulse sequences of the brain and surrounding structures were obtained without intravenous contrast. COMPARISON:  None. FINDINGS: Brain: No acute infarction, hemorrhage, hydrocephalus, extra-axial collection or mass lesion. Normal brain volume. No cortical finding to correlate with seizure. Symmetric unremarkable hippocampus. Vascular: Normal flow voids. Skull and upper cervical spine: Normal marrow signal. Sinuses/Orbits: On axial T2 weighted imaging there is minimal coverage of T2 hyperintensity in the left oral tongue which may be from contusion given the history of seizure. IMPRESSION: No acute finding or visible seizure focus. Possible oral tongue contusion on the left, please correlate with exam. Electronically Signed   By: Tiburcio Pea M.D.   On: 07/23/2021 06:48   DG CHEST PORT 1 VIEW  Result Date: 07/23/2021 CLINICAL DATA:  Seizure EXAM: PORTABLE CHEST 1 VIEW COMPARISON:  None. FINDINGS: The heart size and mediastinal contours are within normal limits. Both lungs are clear. The visualized skeletal structures are unremarkable. IMPRESSION: No active disease. Electronically Signed   By: Sharlet Salina M.D.   On: 07/23/2021 01:01   EEG adult  Result Date: 07/23/2021 Charlsie Quest, MD     07/23/2021 10:26 AM Patient Name: [facility] MRN: [id number] Epilepsy Attending: Charlsie Quest Referring Physician/Provider: Dr Odie Sera Date: 07/23/2021 Duration: 23.08 mins Patient history: 20 year old male with witnessed generalized seizure.  EEG to evaluate for seizure. Level of alertness: Awake, asleep AEDs during EEG study: LEV Technical aspects: This EEG study was done with scalp electrodes positioned according to the 10-20  International system of electrode placement. Electrical activity was acquired at a sampling rate of 500Hz  and reviewed with a high frequency filter of 70Hz  and a low frequency filter of 1Hz . EEG data were recorded continuously and digitally stored. Description: The posterior dominant rhythm consists of 9-10 Hz activity of moderate voltage (25-35 uV) seen predominantly in posterior head regions, symmetric and reactive to eye opening and eye closing. Sleep was characterized by vertex waves, sleep spindles (12 to 14 Hz), maximal frontocentral region.  Hyperventilation and photic stimulation were not performed. Of note, EEG was technically difficult due to EKG artifact. IMPRESSION: This study is within normal limits. No seizures or epileptiform discharges were seen throughout the recording. Priyanka     Subjective: - no chest pain, shortness of breath, no abdominal pain, nausea or vomiting.   Discharge Exam: BP 122/76    Pulse 68    Temp 99 F (37.2 C)    Resp (!) 22    SpO2 99%   General: Pt is alert, awake, not in acute distress Cardiovascular: RRR, S1/S2 +, no rubs, no gallops Respiratory: CTA bilaterally, no wheezing, no rhonchi Abdominal: Soft, NT, ND, bowel sounds + Extremities: no edema, no cyanosis   The results of significant diagnostics from this hospitalization (including imaging, microbiology, ancillary and laboratory) are listed below for reference.     Microbiology: Recent Results (from the past 240 hour(s))  Resp Panel by RT-PCR (Flu A&B, Covid) Nasopharyngeal  Swab     Status: None   Collection Time: 07/22/21  5:31 PM   Specimen: Nasopharyngeal Swab; Nasopharyngeal(NP) swabs in vial transport medium  Result Value Ref Range Status   SARS Coronavirus 2 by RT PCR NEGATIVE NEGATIVE Final    Comment: (NOTE) SARS-CoV-2 target nucleic acids are NOT DETECTED.  The SARS-CoV-2 RNA is generally detectable in upper respiratory specimens during the acute phase of infection. The  lowest concentration of SARS-CoV-2 viral copies this assay can detect is 138 copies/mL. A negative result does not preclude SARS-Cov-2 infection and should not be used as the sole basis for treatment or other patient management decisions. A negative result may occur with  improper specimen collection/handling, submission of specimen other than nasopharyngeal swab, presence of viral mutation(s) within the areas targeted by this assay, and inadequate number of viral copies(<138 copies/mL). A negative result must be combined with clinical observations, patient history, and epidemiological information. The expected result is Negative.  Fact Sheet for Patients:  BloggerCourse.com  Fact Sheet for Healthcare Providers:  SeriousBroker.it  This test is no t yet approved or cleared by the [place] FDA and  has been authorized for detection and/or diagnosis of SARS-CoV-2 by FDA under an Emergency Use Authorization (EUA). This EUA will remain  in effect (meaning this test can be used) for the duration of the COVID-19 declaration under Section 564(b)(1) of the Act, 21 U.S.C.section 360bbb-3(b)(1), unless the authorization is terminated  or revoked sooner.       Influenza A by PCR NEGATIVE NEGATIVE Final   Influenza B by PCR NEGATIVE NEGATIVE Final    Comment: (NOTE) The Xpert Xpress SARS-CoV-2/FLU/RSV plus assay is intended as an aid in the diagnosis of influenza from Nasopharyngeal swab specimens and should not be used as a sole basis for treatment. Nasal washings and aspirates are unacceptable for Xpert Xpress SARS-CoV-2/FLU/RSV testing.  Fact Sheet for Patients: BloggerCourse.com  Fact Sheet for Healthcare Providers: SeriousBroker.it  This test is not yet approved or cleared by the [place] FDA and has been authorized for detection and/or diagnosis of SARS-CoV-2 by FDA under  an Emergency Use Authorization (EUA). This EUA will remain in effect (meaning this test can be used) for the duration of the COVID-19 declaration under Section 564(b)(1) of the Act, 21 U.S.C. section 360bbb-3(b)(1), unless the authorization is terminated or revoked.  Performed at [facility], [address] [place], [place] [address]      Labs: Basic Metabolic Panel: Recent Labs  Lab 07/22/21 1310 07/22/21 2250 07/23/21 0353  NA 138 142 136  K 4.8 4.5 3.5  CL 106 109 109  CO2 22 11* 20*  GLUCOSE 114* 117* 87  BUN CREATININE 0.95 1.18 1.04  CALCIUM 9.2 9.6 8.7*  MG  --  2.8*  --   PHOS  --  4.8*  --    Liver Function Tests: Recent Labs  Lab 07/23/21 0353  AST 30  ALT 43  ALKPHOS 77  BILITOT 0.7  PROT 7.1  ALBUMIN 3.9   CBC: Recent Labs  Lab 07/22/21 1310 07/22/21 2250 07/23/21 0353  WBC 11.9* 29.9* 18.0*  NEUTROABS  --  22.8*  --   HGB 14.5 15.3 13.6  HCT 45.2 48.8 40.8  MCV 91.7 95.3 89.1  PLT 381 466* 368   CBG: Recent Labs  Lab 07/22/21 1457 07/22/21 2050  GLUCAP 111* 84   Hgb A1c No results for input(s): HGBA1C in the last 72 hours. Lipid  Profile No results for input(s): CHOL, HDL, LDLCALC, TRIG, CHOLHDL, LDLDIRECT in the last 72 hours. Thyroid function studies No results for input(s): TSH, T4TOTAL, T3FREE, THYROIDAB in the last 72 hours.  Invalid input(s): FREET3 Urinalysis    Component Value Date/Time   COLORURINE STRAW (A) 07/22/2021 1552   APPEARANCEUR CLEAR 07/22/2021 1552   LABSPEC 1.013 07/22/2021 1552   PHURINE 6.0 07/22/2021 1552   GLUCOSEU NEGATIVE 07/22/2021 1552   HGBUR NEGATIVE 07/22/2021 1552   BILIRUBINUR NEGATIVE 07/22/2021 1552   KETONESUR NEGATIVE 07/22/2021 1552   PROTEINUR NEGATIVE 07/22/2021 1552   NITRITE NEGATIVE 07/22/2021 1552   LEUKOCYTESUR NEGATIVE 07/22/2021 1552    FURTHER DISCHARGE INSTRUCTIONS:   Get Medicines reviewed and adjusted: Please take all your medications with you for  your next visit with your Primary MD   Laboratory/radiological data: Please request your Primary MD to go over all hospital tests and procedure/radiological results at the follow up, please ask your Primary MD to get all Hospital records sent to his/her office.   In some cases, they will be blood work, cultures and biopsy results pending at the time of your discharge. Please request that your primary care M.D. goes through all the records of your hospital data and follows up on these results.   Also Note the following: If you experience worsening of your admission symptoms, develop shortness of breath, life threatening emergency, suicidal or homicidal thoughts you must seek medical attention immediately by calling 911 or calling your MD immediately  if symptoms less severe.   You must read complete instructions/literature along with all the possible adverse reactions/side effects for all the Medicines you take and that have been prescribed to you. Take any new Medicines after you have completely understood and accpet all the possible adverse reactions/side effects.    Do not drive when taking Pain medications or sleeping medications (Benzodaizepines)   Do not take more than prescribed Pain, Sleep and Anxiety Medications. It is not advisable to combine anxiety,sleep and pain medications without talking with your primary care practitioner   Special Instructions: If you have smoked or chewed Tobacco  in the last 2 yrs please stop smoking, stop any regular Alcohol  and or any Recreational drug use.   Wear Seat belts while driving.   Please note: You were cared for by a hospitalist during your hospital stay. Once you are discharged, your primary care physician will handle any further medical issues. Please note that NO REFILLS for any discharge medications will be authorized once you are discharged, as it is imperative that you return to your primary care physician (or establish a relationship with a  primary care physician if you do not have one) for your post hospital discharge needs so that they can reassess your need for medications and monitor your lab values.  Time coordinating discharge: 35 minutes  SIGNED:  Pamella Pert, MD, PhD 07/23/2021, 1:01 PM

## 2021-07-23 NOTE — H&P (Signed)
History and Physical    William Koch VEL:[id number] DOB: [birth year] DOA: 07/22/2021  PCP: System, Provider Not In   Patient coming from: Home   Chief Complaint: Generalized seizures   HPI: William Koch is a pleasant 20 y.o. 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  Patient was in his usual state of health, woke to use the bathroom yesterday morning, returned to bed, and then had generalized convulsions witnessed by his girlfriend.  Patient's mother was called to the room and she also witnessed generalized shaking for 3 to 5 minutes.  Patient was then snoring loudly and difficult to wake for a couple minutes, and this was followed by lethargy and confusion.  He was brought into the ED where he had unremarkable basic blood work, no acute findings on head CT, returned to his usual state, and was discharged home.  Shortly after returning home, patient had a second generalized seizure witnessed by his girlfriend while he was taking a bath and texting on his cell phone.  He was then brought into the emergency department with EMS where he fell asleep in triage and had a third generalized seizure while sleeping.  He bit his tongue and had urinary incontinence.  Patient complains of generalized headache, but no other complaints and specifically denied any recent fevers or chills.  He denied alcohol or illicit substance use.  There is a family history of seizures in his paternal grandmother and aunt.  ED Course: Upon arrival to the ED, patient is found to be afebrile, saturating well on room air, slightly tachycardic, and with blood pressure 118/73.  EKG features sinus tachycardia with rate 100 and normal QT interval.  Chest x-ray is negative for acute cardiopulmonary disease.  UDS positive for THC only.  Urinalysis unremarkable.  Chemistry panel notable for bicarbonate of 11 and anion gap 22.  CBC  with leukocytosis to 29,900 and mild thrombocytosis.  Neurology was consulted by the ED physician, MRI brain was ordered, and the patient was treated with 2 g IV Keppra and 1 mg IV Ativan in the ED.  Review of Systems:  All other systems reviewed and apart from HPI, are negative.  History reviewed. No pertinent past medical history.  Past Surgical History:  Procedure Laterality Date   LEG AMPUTATION BELOW KNEE      Social History:   reports that he has never smoked. He does not have any smokeless tobacco history on file. He reports that he does not drink alcohol and does not use drugs.  Allergies  Allergen Reactions   Latex Rash    Burning of skin.    Family History  Problem Relation Age of Onset   Diabetes Father    Hypertension Father    Cancer Father    Stroke Other    Seizures Paternal Grandmother    Seizures Paternal Aunt    Febrile seizures Cousin      Prior to Admission medications   Medication Sig Start Date End Date Taking? Authorizing Provider  acetaminophen (TYLENOL) 500 MG tablet Take 500 mg by mouth every 6 (six) hours as needed for headache.   Yes [provider]  fluticasone (FLONASE) 50 MCG/ACT nasal spray Place 2 sprays into both nostrils daily for 14 days. Patient not taking: Reported on 07/22/2021 08/05/20 07/22/21  Gailen Shelter, PA    Physical Exam: Vitals:   07/23/21 0000 07/23/21 0030 07/23/21 0100 07/23/21 0125  BP: (!) 95/45 (!) 100/44 (!) 148/43 119/70  Pulse: 90 85 88 89  Resp: (!) 26 13 (!) 23 (!) 23  Temp:      SpO2: 93% 97% 97% 97%    Constitutional: NAD, somnolent   Eyes: PERTLA, lids and conjunctivae normal ENMT: Mucous membranes are moist. Posterior pharynx clear of any exudate or lesions.   Neck: supple, no masses  Respiratory: no wheezing, no crackles. No accessory muscle use.  Cardiovascular: S1 & S2 heard, regular rate and rhythm. No extremity edema.   Abdomen: No distension, no tenderness, soft. Bowel sounds  active.  Musculoskeletal: no clubbing / cyanosis. S/p right foot amputation.   Skin: no significant rashes, lesions, ulcers. Warm, dry, well-perfused. Neurologic: CN 2-12 grossly intact. Sensation intact. Strength 5/5 in all 4 limbs. Sleeping, wakes to voice and oriented to person, place, and situation.  Psychiatric: Pleasant. Cooperative.    Labs and Imaging on Admission: I have personally reviewed following labs and imaging studies  CBC: Recent Labs  Lab 07/22/21 1310 07/22/21 2250  WBC 11.9* 29.9*  NEUTROABS  --  22.8*  HGB 14.5 15.3  HCT 45.2 48.8  MCV 91.7 95.3  PLT 381 466*   Basic Metabolic Panel: Recent Labs  Lab 07/22/21 1310 07/22/21 2250  NA 138 142  K 4.8 4.5  CL 106 109  CO2 22 11*  GLUCOSE 114* 117*  BUN 12 11  CREATININE 0.95 1.18  CALCIUM 9.2 9.6  MG  --  2.8*  PHOS  --  4.8*   GFR: CrCl cannot be calculated (Unknown ideal weight.). Liver Function Tests: No results for input(s): AST, ALT, ALKPHOS, BILITOT, PROT, ALBUMIN in the last 168 hours. No results for input(s): LIPASE, AMYLASE in the last 168 hours. No results for input(s): AMMONIA in the last 168 hours. Coagulation Profile: No results for input(s): INR, PROTIME in the last 168 hours. Cardiac Enzymes: No results for input(s): CKTOTAL, CKMB, CKMBINDEX, TROPONINI in the last 168 hours. BNP (last 3 results) No results for input(s): PROBNP in the last 8760 hours. HbA1C: No results for input(s): HGBA1C in the last 72 hours. CBG: Recent Labs  Lab 07/22/21 1457 07/22/21 2050  GLUCAP 111* 84   Lipid Profile: No results for input(s): CHOL, HDL, LDLCALC, TRIG, CHOLHDL, LDLDIRECT in the last 72 hours. Thyroid Function Tests: No results for input(s): TSH, T4TOTAL, FREET4, T3FREE, THYROIDAB in the last 72 hours. Anemia Panel: No results for input(s): VITAMINB12, FOLATE, FERRITIN, TIBC, IRON, RETICCTPCT in the last 72 hours. Urine analysis:    Component Value Date/Time   COLORURINE STRAW (A)  07/22/2021 1552   APPEARANCEUR CLEAR 07/22/2021 1552   LABSPEC 1.013 07/22/2021 1552   PHURINE 6.0 07/22/2021 1552   GLUCOSEU NEGATIVE 07/22/2021 1552   HGBUR NEGATIVE 07/22/2021 1552   BILIRUBINUR NEGATIVE 07/22/2021 1552   KETONESUR NEGATIVE 07/22/2021 1552   PROTEINUR NEGATIVE 07/22/2021 1552   NITRITE NEGATIVE 07/22/2021 1552   LEUKOCYTESUR NEGATIVE 07/22/2021 1552   Sepsis Labs: (procalcitonin:4,lacticidven:4) ) Recent Results (from the past 240 hour(s))  Resp Panel by RT-PCR (Flu A&B, Covid) Nasopharyngeal Swab     Status: None   Collection Time: 07/22/21  5:31 PM   Specimen: Nasopharyngeal Swab; Nasopharyngeal(NP) swabs in vial transport medium  Result Value Ref Range Status   SARS Coronavirus 2 by RT PCR NEGATIVE NEGATIVE Final    Comment: (NOTE) SARS-CoV-2 target nucleic acids are NOT DETECTED.  The SARS-CoV-2 RNA is generally detectable in upper respiratory specimens during the acute phase of infection.  The lowest concentration of SARS-CoV-2 viral copies this assay can detect is 138 copies/mL. A negative result does not preclude SARS-Cov-2 infection and should not be used as the sole basis for treatment or other patient management decisions. A negative result may occur with  improper specimen collection/handling, submission of specimen other than nasopharyngeal swab, presence of viral mutation(s) within the areas targeted by this assay, and inadequate number of viral copies(<138 copies/mL). A negative result must be combined with clinical observations, patient history, and epidemiological information. The expected result is Negative.  Fact Sheet for Patients:  BloggerCourse.com  Fact Sheet for Healthcare Providers:  SeriousBroker.it  This test is no t yet approved or cleared by the [place] FDA and  has been authorized for detection and/or diagnosis of SARS-CoV-2 by FDA under an Emergency Use  Authorization (EUA). This EUA will remain  in effect (meaning this test can be used) for the duration of the COVID-19 declaration under Section 564(b)(1) of the Act, 21 U.S.C.section 360bbb-3(b)(1), unless the authorization is terminated  or revoked sooner.       Influenza A by PCR NEGATIVE NEGATIVE Final   Influenza B by PCR NEGATIVE NEGATIVE Final    Comment: (NOTE) The Xpert Xpress SARS-CoV-2/FLU/RSV plus assay is intended as an aid in the diagnosis of influenza from Nasopharyngeal swab specimens and should not be used as a sole basis for treatment. Nasal washings and aspirates are unacceptable for Xpert Xpress SARS-CoV-2/FLU/RSV testing.  Fact Sheet for Patients: BloggerCourse.com  Fact Sheet for Healthcare Providers: SeriousBroker.it  This test is not yet approved or cleared by the [place] FDA and has been authorized for detection and/or diagnosis of SARS-CoV-2 by FDA under an Emergency Use Authorization (EUA). This EUA will remain in effect (meaning this test can be used) for the duration of the COVID-19 declaration under Section 564(b)(1) of the Act, 21 U.S.C. section 360bbb-3(b)(1), unless the authorization is terminated or revoked.  Performed at [facility], [address], [place], [place] [address]      Radiological Exams on Admission: CT HEAD WO CONTRAST  Result Date: 07/22/2021 CLINICAL DATA:  New onset seizures.  No trauma. EXAM: CT HEAD WITHOUT CONTRAST TECHNIQUE: Contiguous axial images were obtained from the base of the skull through the vertex without intravenous contrast. COMPARISON:  None. FINDINGS: Brain: No evidence of acute infarction, hemorrhage, hydrocephalus, extra-axial collection or mass lesion/mass effect. Vascular: No hyperdense vessel or unexpected calcification. Skull: Calvarium appears intact. Sinuses/Orbits: Paranasal sinuses and mastoid air cells are clear. Other: None. IMPRESSION:  No acute intracranial abnormalities. Electronically Signed   By: Burman Nieves M.D.   On: 07/22/2021 16:31   DG CHEST PORT 1 VIEW  Result Date: 07/23/2021 CLINICAL DATA:  Seizure EXAM: PORTABLE CHEST 1 VIEW COMPARISON:  None. FINDINGS: The heart size and mediastinal contours are within normal limits. Both lungs are clear. The visualized skeletal structures are unremarkable. IMPRESSION: No active disease. Electronically Signed   By: Sharlet Salina M.D.   On: 07/23/2021 01:01    EKG: Independently reviewed. Sinus tachycardia, rate 100, QTc 454 ms.   Assessment/Plan   1. Seizures  - Presents with new seizures, 2 witnessed generalized seizures at home and 1 in triage   - Etiology unclear, no acute findings on head CT  - Loaded with Keppra in ED  - Appreciate neurology consultants  - Check MRI brain, EEG, and mag level, continue seizure precautions, continue Keppra    2. SIRS  - Leukocytosis and mild tachycardia noted  in ED without fever or evidence for infection  - Likely reactive, culture if febrile    3. Metabolic acidosis  - Serum bicarb 11 and AG 22 in ED   - Drawn shortly after seizure in triage, likely lactic acidosis, anticipate quick resolution, repeat chem panel in am     DVT prophylaxis: Lovenox  Code Status: Full  Level of Care: Level of care: Telemetry Medical Family Communication: Mother at bedside  Disposition Plan:  Patient is from: home  Anticipated d/c is to: Home  Anticipated d/c date is: 12/31 or 07/25/21  Patient currently: Pending MRI brain, EEG, return to baseline, neurology consultation  Consults called: Neurology  Admission status: Observation    Briscoe Deutscher, MD [facility]  07/23/2021, 2:11 AM

## 2021-07-23 NOTE — ED Notes (Signed)
Patient transported to MRI via stretcher and accompanied by mother

## 2021-07-23 NOTE — Progress Notes (Signed)
Patient seen and examined this morning, admitted overnight, H&P reviewed and agree with assessment and plan.  He is doing well this morning, MRI negative.  Awaiting EEG and neurology input  Araf Clugston M. Elvera Lennox, MD, PhD [facility]  Between 7 am - 7 pm you can contact me via Amion (for emergencies) or Securechat (non urgent matters).  I am not available 7 pm - 7 am, please contact night coverage MD/APP via Amio

## 2021-07-23 NOTE — ED Notes (Signed)
Patient is resting comfortably.report received from Latanya Presser., RN Mom at [facility]. Monitors applied. Call bell in reach

## 2021-07-23 NOTE — Consult Note (Signed)
Neurology Consult H&P  Shravan Salahuddin MR# [id number] 07/23/2021   CC: seizure  History is obtained from: patient mother and chart.  HPI: William Koch is a 20 y.o. male PMHx as reviewed below woke to use the bathroom yesterday morning, back to bed and had generalized convulsions witnessed by his girlfriend. Patient's mother came to room and also witnessed generalized shaking ~3-5 minutes. Patient was then snoring loudly and difficult to wake for a couple of minutes followed by lethargy and confusion. In the ED unremarkable blood work, no acute findings on head CT. He returned to baseline and was discharged home. Shortly after getting home, the patient had a second generalized seizure witnessed by his girlfriend while he was taking a bath and texting on his cell phone. He was again brought to the emergency department with EMS where he fell asleep in triage and had a third generalized seizure while sleeping. He bit his tongue and had urinary incontinence. Patient complains of generalized headache, but no other complaints and specifically denied any recent fevers or chills.  He denied alcohol or illicit substance use. There is a family history of seizures in his paternal grandmother and aunt.   Semiology: He has no warning sudden LOC without recollection. Mom described all seizures as being generalized tonic clonic followed by 10-15 minutes of postictal confusion.  There was associated tongue biting.  First seizure lasted ~5 minutes, second ~3 minutes, third ~2 minutes. Mom stated first one occurred during sleep, the second one while in the bathtub and third while in the ED after falling asleep.   Denies prior seizure, aura, TBI with LOC, first degree relatives with seizures.   ROS: A complete ROS was performed and is negative except as noted in the HPI.   History reviewed. No pertinent past medical history.   Family History  Problem Relation Age of Onset   Diabetes Father    Hypertension  Father    Cancer Father    Stroke Other    Seizures Paternal Grandmother    Seizures Paternal Aunt    Febrile seizures Cousin     Social History:  reports that he has never smoked. He does not have any smokeless tobacco history on file. He reports that he does not drink alcohol and does not use drugs.   Prior to Admission medications   Medication Sig Start Date End Date Taking? Authorizing Provider  acetaminophen (TYLENOL) 500 MG tablet Take 500 mg by mouth every 6 (six) hours as needed for headache.   Yes [provider]  fluticasone (FLONASE) 50 MCG/ACT nasal spray Place 2 sprays into both nostrils daily for 14 days. Patient not taking: Reported on 07/22/2021 08/05/20 07/22/21  Gailen Shelter, PA   Exam: Current vital signs: BP (!) 125/51    Pulse 82    Temp 99 F (37.2 C)    Resp 18    SpO2 98%   Physical Exam  Constitutional: Appears well-developed and well-nourished.  Psych: Affect appropriate to situation Eyes: No scleral injection HENT: No OP obstruction. Head: Normocephalic.  Cardiovascular: Normal rate and regular rhythm.  Respiratory: Effort normal, symmetric excursions bilaterally, no audible wheezing. GI: Soft.  No distension. There is no tenderness.  Skin: WDI  Neuro: Mental Status: Patient is awake, alert, oriented to person, place, month, year, and situation. Patient is able to give a clear and coherent history. Speech fluent, intact comprehension and repetition. No signs of aphasia or neglect. Visual Fields are full. Pupils are equal, round, and reactive  to light. EOMI without ptosis or diploplia.  Facial sensation is symmetric to temperature Facial movement is symmetric.  Hearing is intact to voice. Uvula midline and palate elevates symmetrically. Shoulder shrug is symmetric. Tongue is midline without atrophy or fasciculations.  Tone is normal. Bulk is normal. 5/5 strength was present in all four extremities. Sensation is symmetric to light  touch and temperature in the arms and legs. Deep Tendon Reflexes: 2+ and symmetric in the biceps and patellae. Toes are downgoing bilaterally. FNF and HKS are intact bilaterally. Gait - Deferred  I have reviewed labs in epic and the pertinent results are: Ca 2.8     Component Value Date/Time   LABOPIA NONE DETECTED 07/22/2021 1552   COCAINSCRNUR NONE DETECTED 07/22/2021 1552   LABBENZ NONE DETECTED 07/22/2021 1552   AMPHETMU NONE DETECTED 07/22/2021 1552   THCU POSITIVE (A) 07/22/2021 1552   LABBARB NONE DETECTED 07/22/2021 1552    I have reviewed the images obtained: MRI head showed no acute finding or visible seizure focus.  EEG reviewed which was within normal limits and showed normal reactive PDR 10-11Hz  without epileptiform discharges.  Assessment: William Koch is a 20 y.o. male PMHx as noted above with new onset seizure. He had 3 seizures separated in time during one 24 hour period, with complete return to baseline in between.  There are no obvious provoking factors and both EEG and MRI were normal. However, he had 3 within one day and it would be prudent to continue levetiracetam until he has a follow up with outpatient neurology.  -He was advised to continue Keppra/levetiracetam 500mg  two times daily for now.  -If there is a breakthrough seizure lasting more than five minutes or if there is a seizure less than 5 minutes without return to baseline followed by a second seizure proceed to closest ED.  -If there is a seizure less than 5 minutes which completely resolves, immediately take another tablet and break some tablets in half  and increase to 1.5 tablets two times daily and call outpatient neurology for new prescription at higher dose.  -Follow up with outpatient neurology.  We discussed Seizure precautions: Per [facility] statutes, patients with seizures are not allowed to drive until they have been seizure-free for six months and cleared by a physician.   Use  caution when using heavy equipment or power tools. Avoid working on ladders or at heights. Take showers instead of baths. Ensure the water temperature is not too high on the home water heater. Do not go swimming alone. Do not lock yourself in a room alone (i.e. bathroom). When caring for infants or small children, sit down when holding, feeding, or changing them to minimize risk of injury to the child in the event you have a seizure. Maintain good sleep hygiene. Avoid alcohol.    Electronically signed by:  [facility], MD Page: Marisue Humble 07/23/2021, 8:03 AM

## 2021-07-29 ENCOUNTER — Encounter: Payer: Self-pay | Admitting: Neurology

## 2021-07-29 ENCOUNTER — Other Ambulatory Visit: Payer: Self-pay

## 2021-07-29 ENCOUNTER — Ambulatory Visit: Payer: Medicaid Other | Admitting: Neurology

## 2021-07-29 VITALS — BP 130/83 | HR 63 | Ht 67.0 in | Wt 300.0 lb

## 2021-07-29 DIAGNOSIS — Z5181 Encounter for therapeutic drug level monitoring: Secondary | ICD-10-CM

## 2021-07-29 DIAGNOSIS — G40909 Epilepsy, unspecified, not intractable, without status epilepticus: Secondary | ICD-10-CM | POA: Diagnosis not present

## 2021-07-29 MED ORDER — LEVETIRACETAM 500 MG PO TABS: 500.0000 mg | ORAL_TABLET | Freq: Two times a day (BID) | ORAL | 6 refills | Status: DC

## 2021-07-29 MED ORDER — NAYZILAM 5 MG/0.1ML NA SOLN: 5.0000 mg | NASAL | 2 refills | Status: AC | PRN

## 2021-07-29 NOTE — Patient Instructions (Addendum)
Continue current medications   Keppra 500 mg BID  Return in 3 months    Per [facility] statutes, patients with seizures are not allowed to drive until they have been seizure-free for six months.  Other recommendations include using caution when using heavy equipment or power tools. Avoid working on ladders or at heights. Take showers instead of baths.  Do not swim alone.  Ensure the water temperature is not too high on the home water heater. Do not go swimming alone. Do not lock yourself in a room alone (i.e. bathroom). When caring for infants or small children, sit down when holding, feeding, or changing them to minimize risk of injury to the child in the event you have a seizure. Maintain good sleep hygiene. Avoid alcohol.  Also recommend adequate sleep, hydration, good diet and minimize stress.   During the Seizure  - First, ensure adequate ventilation and place patients on the floor on their left side  Loosen clothing around the neck and ensure the airway is patent. If the patient is clenching the teeth, do not force the mouth open with any object as this can cause severe damage - Remove all items from the surrounding that can be hazardous. The patient may be oblivious to what's happening and may not even know what he or she is doing. If the patient is confused and wandering, either gently guide him/her away and block access to outside areas - Reassure the individual and be comforting - Call 911. In most cases, the seizure ends before EMS arrives. However, there are cases when seizures may last over 3 to 5 minutes. Or the individual may have developed breathing difficulties or severe injuries. If a pregnant patient or a person with diabetes develops a seizure, it is prudent to call an ambulance. - Finally, if the patient does not regain full consciousness, then call EMS. Most patients will remain confused for about 45 to 90 minutes after a seizure, so you must use judgment in calling for  help. - Avoid restraints but make sure the patient is in a bed with padded side rails - Place the individual in a lateral position with the neck slightly flexed; this will help the saliva drain from the mouth and prevent the tongue from falling backward - Remove all nearby furniture and other hazards from the area - Provide verbal assurance as the individual is regaining consciousness - Provide the patient with privacy if possible - Call for help and start treatment as ordered by the caregiver   After the Seizure (Postictal Stage)  After a seizure, most patients experience confusion, fatigue, muscle pain and/or a headache. Thus, one should permit the individual to sleep. For the next few days, reassurance is essential. Being calm and helping reorient the person is also of importance.  Most seizures are painless and end spontaneously. Seizures are not harmful to others but can lead to complications such as stress on the lungs, brain and the heart. Individuals with prior lung problems may develop labored breathing and respiratory distress.

## 2021-07-29 NOTE — Progress Notes (Signed)
[facility]  PATIENT: William Koch. DOB: [birth year]  REQUESTING CLINICIAN: Roemhildt, Lorin T, PA-C HISTORY FROM: Patient and mother  REASON FOR VISIT: New onset seizure    HISTORICAL  CHIEF COMPLAINT:  Chief Complaint  Patient presents with   New Patient (Initial Visit)    Rm 17, with mother Reports he is feeling fine, reports 3 seizures on 07/22/21 Doing well on keppra     HISTORY OF PRESENT ILLNESS:  This is a 21 year old gentleman with no past medical history who is presenting after 3 seizures on December 29.  Patient report December 29 he had a seizure during sleep in the morning.  Seizure described as tonic-clonic, EMS was called and he was taken to the ED.  In the ED he was back to his normal self, he was noted to have tongue biting, no urinary incontinence and was discharged 2 hours later.  Patient went home, was taking a shower and had another seizure in the bathtub. Mom witnessed the seizure and described as tonic-clonic seizure.  He was taken back to the hospital.  Was initially evaluated, lab was drawn and he was put back on the lobby when he had a third seizure.  He was started on Keppra after the third seizure, he did have a MRI brain with and without contrast which showed no abnormality and his EEG was also normal.  He was admitted for 24 hours, back to his baseline and discharged home with Keppra 500 mg twice daily.  Patient denies any previous history of seizures, denies any seizure risk factor, denies any history of head trauma.  Since being home, he has been doing well, no additional seizures.  He reports compliance with medication and denies any side effect from the medication.   Handedness: right handed   Onset: July 22 2020  Seizure Type: GTC   Current frequency: First time   Any injuries from seizures: None   Seizure risk factors: None reported, paternal grandmother with seizure  Previous ASMs: Levetiracetam   Currenty  ASMs: Levetiracetam 500 mg BID   ASMs side effects: None reported   Brain Images: No acute abnormalities   Previous EEGs: Normal EEG   OTHER MEDICAL CONDITIONS: None reported   REVIEW OF SYSTEMS: Full 14 system review of systems performed and negative with exception of: as noted in the HPI  ALLERGIES: Allergies  Allergen Reactions   Latex Rash    Burning of skin.    HOME MEDICATIONS: Outpatient Medications Prior to Visit  Medication Sig Dispense Refill   acetaminophen (TYLENOL) 500 MG tablet Take 500 mg by mouth every 6 (six) hours as needed for headache.     levETIRAcetam (KEPPRA) 500 MG tablet Take 1 tablet (500 mg total) by mouth 2 (two) times daily. 60 tablet 1   fluticasone (FLONASE) 50 MCG/ACT nasal spray Place 2 sprays into both nostrils daily for 14 days. (Patient not taking: Reported on 07/22/2021) 11.1 mL 0   No facility-administered medications prior to visit.    PAST MEDICAL HISTORY: History reviewed. No pertinent past medical history.  PAST SURGICAL HISTORY: Past Surgical History:  Procedure Laterality Date   LEG AMPUTATION BELOW KNEE      FAMILY HISTORY: Family History  Problem Relation Age of Onset   Diabetes Father    Hypertension Father    Cancer Father    Stroke Other    Seizures Paternal Grandmother    Seizures Paternal Aunt    Febrile seizures Cousin  SOCIAL HISTORY: Social History   Socioeconomic History   Marital status: Single    Spouse name: Not on file   Number of children: Not on file   Years of education: Not on file   Highest education level: Not on file  Occupational History   Not on file  Tobacco Use   Smoking status: Never   Smokeless tobacco: Not on file  Substance and Sexual Activity   Alcohol use: No   Drug use: No   Sexual activity: Not on file  Other Topics Concern   Not on file  Social History Narrative   Not on file   Social Determinants of Health   Financial Resource Strain: Not on file  Food  Insecurity: Not on file  Transportation Needs: Not on file  Physical Activity: Not on file  Stress: Not on file  Social Connections: Not on file  Intimate Partner Violence: Not on file    PHYSICAL EXAM  GENERAL EXAM/CONSTITUTIONAL: Vitals:  Vitals:   07/29/21 1513  BP: 130/83  Pulse: 63  Weight: 300 lb (136.1 kg)  Height: 5\' 7"  (1.702 m)   Body mass index is 46.99 kg/m. Wt Readings from Last 3 Encounters:  07/29/21 300 lb (136.1 kg)  08/05/20 (!) 303 lb (137.4 kg) (>99 %, Z= 3.09)*  05/12/15 252 lb (114.3 kg) (>99 %, Z= 3.28)*   * Growth percentiles are based on CDC (Boys, 2-20 Years) data.   Patient is in no distress; well developed, nourished and groomed; neck is supple  CARDIOVASCULAR: Examination of carotid arteries is normal; no carotid bruits Regular rate and rhythm, no murmurs Examination of peripheral vascular system by observation and palpation is normal  EYES: Pupils round and reactive to light, Visual fields full to confrontation, Extraocular movements intacts,  No results found.  MUSCULOSKELETAL: Gait, strength, tone, movements noted in Neurologic exam below  NEUROLOGIC: MENTAL STATUS:  No flowsheet data found. awake, alert, oriented to person, place and time recent and remote memory intact normal attention and concentration language fluent, comprehension intact, naming intact fund of knowledge appropriate  CRANIAL NERVE 2nd, 3rd, 4th, 6th - pupils equal and reactive to light, visual fields full to confrontation, extraocular muscles intact, no nystagmus 5th - facial sensation symmetric 7th - facial strength symmetric 8th - hearing intact 9th - palate elevates symmetrically, uvula midline 11th - shoulder shrug symmetric 12th - tongue protrusion midline  MOTOR:  normal bulk and tone, full strength in the BUE, BLE  SENSORY:  normal and symmetric to light touch, pinprick, temperature, vibration  COORDINATION:  finger-nose-finger, fine finger  movements normal  REFLEXES:  deep tendon reflexes present and symmetric  GAIT/STATION:  normal     DIAGNOSTIC DATA (LABS, IMAGING, TESTING) - I reviewed patient records, labs, notes, testing and imaging myself where available.  Lab Results  Component Value Date   WBC 18.0 (H) 07/23/2021   HGB 13.6 07/23/2021   HCT 40.8 07/23/2021   MCV 89.1 07/23/2021   PLT 368 07/23/2021      Component Value Date/Time   NA 136 07/23/2021 0353   K 3.5 07/23/2021 0353   CL 109 07/23/2021 0353   CO2 20 (L) 07/23/2021 0353   GLUCOSE 87 07/23/2021 0353   BUN 10 07/23/2021 0353   CREATININE 1.04 07/23/2021 0353   CALCIUM 8.7 (L) 07/23/2021 0353   PROT 7.1 07/23/2021 0353   ALBUMIN 3.9 07/23/2021 0353   AST 30 07/23/2021 0353   ALT 43 07/23/2021 0353   ALKPHOS 77 07/23/2021  0353   BILITOT 0.7 07/23/2021 0353   GFRNONAA >60 07/23/2021 0353   GFRAA NOT CALCULATED 05/12/2015 2329   No results found for: CHOL, HDL, LDLCALC, LDLDIRECT, TRIG No results found for: HGBA1C No results found for: VITAMINB12 No results found for: TSH  MRI Brain with and without contrast 07/23/2021  No acute finding or visible seizure focus.  EEG 07/23/2021: This study is within normal limits. No seizures or epileptiform discharges were seen throughout the recording.    I personally reviewed brain Images and previous EEG reports.   ASSESSMENT AND PLAN  21 y.o. year old male  with no reported past medical history who is presenting after 3 seizures in the period of 24 hours.  The first seizure happened during sleep, and he was noted to be back to his normal self between seizures.  He was started on Keppra 500 mg twice daily in the hospital and has been doing well since.  Denies any side effect from the medication.  I will continue the patient on Keppra 500 mg twice daily, I will obtain a level today as baseline and will also prescribe him a rescue medication for seizure lasting more than 5 minutes.  Him and his  mother were concerned about the medication and was wondering how long they have to be on the medication.  I have explained to them that since his first seizure happened during sleep, and there was no provoking factor, he denies any alcohol intake, denies any illicit drug, denies sleep deprivation, then it is reasonable to continue him on medication.  I explained to them I will continue him on medication at least 1 year while being seizure-free before having any discussion about coming off medication.  Again he denies any provoking factor for seizures.  They will follow-up with me in 3 months or sooner if worse.   1. Seizure disorder (HCC)   2. Therapeutic drug monitoring     Patient Instructions  Continue current medications   Keppra 500 mg BID  Return in 3 months    Per [facility] statutes, patients with seizures are not allowed to drive until they have been seizure-free for six months.  Other recommendations include using caution when using heavy equipment or power tools. Avoid working on ladders or at heights. Take showers instead of baths.  Do not swim alone.  Ensure the water temperature is not too high on the home water heater. Do not go swimming alone. Do not lock yourself in a room alone (i.e. bathroom). When caring for infants or small children, sit down when holding, feeding, or changing them to minimize risk of injury to the child in the event you have a seizure. Maintain good sleep hygiene. Avoid alcohol.  Also recommend adequate sleep, hydration, good diet and minimize stress.   During the Seizure  - First, ensure adequate ventilation and place patients on the floor on their left side  Loosen clothing around the neck and ensure the airway is patent. If the patient is clenching the teeth, do not force the mouth open with any object as this can cause severe damage - Remove all items from the surrounding that can be hazardous. The patient may be oblivious to what's happening and  may not even know what he or she is doing. If the patient is confused and wandering, either gently guide him/her away and block access to outside areas - Reassure the individual and be comforting - Call 911. In most cases, the seizure ends  before EMS arrives. However, there are cases when seizures may last over 3 to 5 minutes. Or the individual may have developed breathing difficulties or severe injuries. If a pregnant patient or a person with diabetes develops a seizure, it is prudent to call an ambulance. - Finally, if the patient does not regain full consciousness, then call EMS. Most patients will remain confused for about 45 to 90 minutes after a seizure, so you must use judgment in calling for help. - Avoid restraints but make sure the patient is in a bed with padded side rails - Place the individual in a lateral position with the neck slightly flexed; this will help the saliva drain from the mouth and prevent the tongue from falling backward - Remove all nearby furniture and other hazards from the area - Provide verbal assurance as the individual is regaining consciousness - Provide the patient with privacy if possible - Call for help and start treatment as ordered by the caregiver   After the Seizure (Postictal Stage)  After a seizure, most patients experience confusion, fatigue, muscle pain and/or a headache. Thus, one should permit the individual to sleep. For the next few days, reassurance is essential. Being calm and helping reorient the person is also of importance.  Most seizures are painless and end spontaneously. Seizures are not harmful to others but can lead to complications such as stress on the lungs, brain and the heart. Individuals with prior lung problems may develop labored breathing and respiratory distress.      Orders Placed This Encounter  Procedures   Levetiracetam level    Meds ordered this encounter  Medications   levETIRAcetam (KEPPRA) 500 MG tablet    Sig:  Take 1 tablet (500 mg total) by mouth 2 (two) times daily.    Dispense:  60 tablet    Refill:  6   Midazolam (NAYZILAM) 5 MG/0.1ML SOLN    Sig: Place 5 mg into the nose as needed (for seizure lasting more than 5 min).    Dispense:  1 each    Refill:  2    Return in about 3 months (around 10/27/2021).    Windell NorfolkAmadou Andree Golphin, MD 07/29/2021, 8:55 PM  [facility] [address], [address] [place], [place] [address] [phone]

## 2021-08-02 LAB — LEVETIRACETAM LEVEL: Levetiracetam Lvl: 9.7 ug/mL — ABNORMAL LOW (ref 10.0–40.0)

## 2021-08-26 ENCOUNTER — Encounter (HOSPITAL_BASED_OUTPATIENT_CLINIC_OR_DEPARTMENT_OTHER): Payer: Self-pay

## 2021-08-26 DIAGNOSIS — R0683 Snoring: Secondary | ICD-10-CM

## 2021-08-26 DIAGNOSIS — R569 Unspecified convulsions: Secondary | ICD-10-CM

## 2021-08-27 ENCOUNTER — Other Ambulatory Visit: Payer: Self-pay

## 2021-08-27 ENCOUNTER — Ambulatory Visit (HOSPITAL_BASED_OUTPATIENT_CLINIC_OR_DEPARTMENT_OTHER): Payer: Medicaid Other | Attending: Internal Medicine | Admitting: Internal Medicine

## 2021-08-27 VITALS — Ht 67.0 in | Wt 300.0 lb

## 2021-08-27 DIAGNOSIS — R0683 Snoring: Secondary | ICD-10-CM | POA: Diagnosis present

## 2021-08-27 DIAGNOSIS — R569 Unspecified convulsions: Secondary | ICD-10-CM

## 2021-09-04 DIAGNOSIS — R0683 Snoring: Secondary | ICD-10-CM

## 2021-09-04 NOTE — Procedures (Signed)
° ° °  Patient Name: William Koch, William Koch Date: 08/27/2021 Gender: Male D.O.B: [birth year] Age (years): 20 Referring Provider: Leilani Able Height (inches): 67 Interpreting Physician: Jetty Duhamel MD, ABSM Weight (lbs): 300 RPSGT: Rolene Arbour BMI: 47 MRN: [id number] Neck Size: 17.00  CLINICAL INFORMATION Sleep Study Type: NPSG Indication for sleep study: Seizure disorder Epworth Sleepiness Score: 4  SLEEP STUDY TECHNIQUE As per the AASM Manual for the Scoring of Sleep and Associated Events v2.3 (April 2016) with a hypopnea requiring 4% desaturations.  The channels recorded and monitored were frontal, central and occipital EEG, electrooculogram (EOG), submentalis EMG (chin), nasal and oral airflow, thoracic and abdominal wall motion, anterior tibialis EMG, snore microphone, electrocardiogram, and pulse oximetry.  MEDICATIONS Medications self-administered by patient taken the night of the study : LEVETIRACETAM  SLEEP ARCHITECTURE The study was initiated at 10:22:20 PM and ended at 4:48:01 AM.  Sleep onset time was 22.4 minutes and the sleep efficiency was 28.8%%. The total sleep time was 111 minutes.  Stage REM latency was N/A minutes.  The patient spent 9.9%% of the night in stage N1 sleep, 90.1%% in stage N2 sleep, 0.0%% in stage N3 and 0% in REM.  Alpha intrusion was absent.  Supine sleep was 30.05%.  RESPIRATORY PARAMETERS The overall apnea/hypopnea index (AHI) was 0.0 per hour. There were 0 total apneas, including 0 obstructive, 0 central and 0 mixed apneas. There were 0 hypopneas and 0 RERAs.  The AHI during Stage REM sleep was N/A per hour.  AHI while supine was 0.0 per hour.  The mean oxygen saturation was 95.8%. The minimum SpO2 during sleep was 93.0%.  moderate snoring was noted during this study.  CARDIAC DATA The 2 lead EKG demonstrated sinus rhythm. The mean heart rate was 58.1 beats per minute. Other EKG findings include: None.  LEG MOVEMENT  DATA The total PLMS were 0 with a resulting PLMS index of 0.0. Associated arousal with leg movement index was 0.0 .  IMPRESSIONS - No significant obstructive sleep apnea occurred during this study (AHI = 0.0/h). - The patient had minimal or no oxygen desaturation during the study (Min O2 = 93.0%) - The patient snored with moderate snoring volume. - No cardiac abnormalities were noted during this study. - Clinically significant periodic limb movements did not occur during sleep. No significant associated arousals. - Patient had slept until 1:00 PM on this day, and had difficulty initiating and maintaining sleep on this study night. Sustained sleep after 02:15  AM, but awake agaain after 03:45 AM. Total sleep time 111 minutes with absent stages N3 and REM.  DIAGNOSIS - Primary Snoring  RECOMMENDATIONS - Manage for symptoms and clinical concerns based on clincal judgment, with attention to sleep habits.. - Sleep hygiene should be reviewed to assess factors that may improve sleep quality. - Weight management and regular exercise should be initiated or continued if appropriate.  [Electronically signed] 09/04/2021 12:18 PM  Jetty Duhamel MD, ABSM Diplomate, American Board of Sleep Medicine   NPI: [id number]                         Jetty Duhamel Diplomate, American Board of Sleep Medicine  ELECTRONICALLY SIGNED ON:  09/04/2021, 12:13 PM [facility] PH: [phone]   FX: [phone] ACCREDITED BY THE AMERICAN ACADEMY OF SLEEP MEDICINE

## 2021-10-28 ENCOUNTER — Other Ambulatory Visit: Payer: Self-pay | Admitting: Family Medicine

## 2021-10-28 ENCOUNTER — Ambulatory Visit
Admission: RE | Admit: 2021-10-28 | Discharge: 2021-10-28 | Disposition: A | Discharge status: Home / Self Care | Payer: Medicaid Other | Source: Ambulatory Visit | Attending: Family Medicine | Admitting: Family Medicine

## 2021-10-28 DIAGNOSIS — M25531 Pain in right wrist: Secondary | ICD-10-CM

## 2021-10-28 DIAGNOSIS — M79641 Pain in right hand: Secondary | ICD-10-CM

## 2021-11-03 ENCOUNTER — Ambulatory Visit: Payer: Medicaid Other | Admitting: Neurology

## 2021-11-23 ENCOUNTER — Encounter: Payer: Self-pay | Admitting: Neurology

## 2021-11-23 ENCOUNTER — Ambulatory Visit: Payer: Medicaid Other | Admitting: Neurology

## 2021-11-23 VITALS — BP 172/87 | HR 69 | Ht 67.0 in | Wt 299.0 lb

## 2021-11-23 DIAGNOSIS — G40909 Epilepsy, unspecified, not intractable, without status epilepticus: Secondary | ICD-10-CM | POA: Diagnosis not present

## 2021-11-23 MED ORDER — LEVETIRACETAM 500 MG PO TABS: 500.0000 mg | ORAL_TABLET | Freq: Two times a day (BID) | ORAL | 6 refills | Status: DC

## 2021-11-23 NOTE — Patient Instructions (Signed)
Continue Keppra 500 mg twice daily, refill given ?Follow-up in 6 months, or sooner if worse. ?

## 2021-11-23 NOTE — Progress Notes (Signed)
? ?[facility] ? ?PATIENT: William Koch. ?DOB: [birth year] ? ?REQUESTING CLINICIAN: Lin Landsman, MD ?HISTORY FROM: Patient and mother  ?REASON FOR VISIT: New onset seizure ? ?  ?HISTORICAL ? ?CHIEF COMPLAINT:  ?Chief Complaint  ?Patient presents with  ? Follow-up  ?  Rm 15. Accompanied by mother. ?Denies any new seizures. Remains on Keppra.  ? ?INTERVAL HISTORY 11/23/2021 ?Patient presents today for follow-up, he is accompanied by his mom.  Denies any seizures since last visit.  He is tolerating the Keppra well.  Denies any side effect from the medication.  His last Keppra level was 9.7. ? ? ?HISTORY OF PRESENT ILLNESS:  ?This is a 21 year old gentleman with no past medical history who is presenting after 3 seizures on December 29.  Patient report December 29 he had a seizure during sleep in the morning.  Seizure described as tonic-clonic, EMS was called and he was taken to the ED.  In the ED he was back to his normal self, he was noted to have tongue biting, no urinary incontinence and was discharged 2 hours later.  Patient went home, was taking a shower and had another seizure in the bathtub. Mom witnessed the seizure and described as tonic-clonic seizure.  He was taken back to the hospital.  Was initially evaluated, lab was drawn and he was put back on the lobby when he had a third seizure.  He was started on Keppra after the third seizure, he did have a MRI brain with and without contrast which showed no abnormality and his EEG was also normal.  He was admitted for 24 hours, back to his baseline and discharged home with Keppra 500 mg twice daily.  Patient denies any previous history of seizures, denies any seizure risk factor, denies any history of head trauma.  Since being home, he has been doing well, no additional seizures.  He reports compliance with medication and denies any side effect from the medication. ? ? ?Handedness: right handed  ? ?Onset: July 22 2020 ? ?Seizure Type:  GTC  ? ?Current frequency: First time  ? ?Any injuries from seizures: None  ? ?Seizure risk factors: None reported, paternal grandmother with seizure ? ?Previous ASMs: Levetiracetam  ? ?Currenty ASMs: Levetiracetam 500 mg BID  ? ?ASMs side effects: None reported  ? ?Brain Images: No acute abnormalities  ? ?Previous EEGs: Normal EEG ? ? ?OTHER MEDICAL CONDITIONS: None reported  ? ?REVIEW OF SYSTEMS: Full 14 system review of systems performed and negative with exception of: as noted in the HPI ? ?ALLERGIES: ?Allergies  ?Allergen Reactions  ? Latex Rash  ?  Burning of skin.  ? ? ?HOME MEDICATIONS: ?Outpatient Medications Prior to Visit  ?Medication Sig Dispense Refill  ? acetaminophen (TYLENOL) 500 MG tablet Take 500 mg by mouth every 6 (six) hours as needed for headache.    ? Midazolam (NAYZILAM) 5 MG/0.1ML SOLN Place 5 mg into the nose as needed (for seizure lasting more than 5 min). 1 each 2  ? levETIRAcetam (KEPPRA) 500 MG tablet Take 1 tablet (500 mg total) by mouth 2 (two) times daily. 60 tablet 6  ? ?No facility-administered medications prior to visit.  ? ? ?PAST MEDICAL HISTORY: ?History reviewed. No pertinent past medical history. ? ?PAST SURGICAL HISTORY: ?Past Surgical History:  ?Procedure Laterality Date  ? LEG AMPUTATION BELOW KNEE    ? ? ?FAMILY HISTORY: ?Family History  ?Problem Relation Age of Onset  ? Diabetes Father   ? Hypertension Father   ?  Cancer Father   ? Stroke Other   ? Seizures Paternal Grandmother   ? Seizures Paternal Aunt   ? Febrile seizures Cousin   ? ? ?SOCIAL HISTORY: ?Social History  ? ?Socioeconomic History  ? Marital status: Significant Other  ?  Spouse name: Not on file  ? Number of children: Not on file  ? Years of education: Not on file  ? Highest education level: Not on file  ?Occupational History  ? Not on file  ?Tobacco Use  ? Smoking status: Never  ? Smokeless tobacco: Not on file  ?Substance and Sexual Activity  ? Alcohol use: No  ? Drug use: No  ? Sexual activity: Not on  file  ?Other Topics Concern  ? Not on file  ?Social History Narrative  ? Not on file  ? ?Social Determinants of Health  ? ?Financial Resource Strain: Not on file  ?Food Insecurity: Not on file  ?Transportation Needs: Not on file  ?Physical Activity: Not on file  ?Stress: Not on file  ?Social Connections: Not on file  ?Intimate Partner Violence: Not on file  ? ? ?PHYSICAL EXAM ? ?GENERAL EXAM/CONSTITUTIONAL: ?Vitals:  ?Vitals:  ? 11/23/21 1445  ?BP: (!) 172/87  ?Pulse: 69  ?Weight: 299 lb (135.6 kg)  ?Height: 5\' 7"  (1.702 m)  ? ?Body mass index is 46.83 kg/m?. ?Wt Readings from Last 3 Encounters:  ?11/23/21 299 lb (135.6 kg)  ?08/27/21 300 lb (136.1 kg)  ?07/29/21 300 lb (136.1 kg)  ? ?Patient is in no distress; well developed, nourished and groomed; neck is supple ? ?CARDIOVASCULAR: ?Examination of carotid arteries is normal; no carotid bruits ?Regular rate and rhythm, no murmurs ?Examination of peripheral vascular system by observation and palpation is normal ? ?EYES: ?Pupils round and reactive to light, Visual fields full to confrontation, Extraocular movements intacts,  ?No results found. ? ?MUSCULOSKELETAL: ?Gait, strength, tone, movements noted in Neurologic exam below ? ?NEUROLOGIC: ?MENTAL STATUS:  ?   ? View : No data to display.  ?  ?  ?  ? ?awake, alert, oriented to person, place and time ?recent and remote memory intact ?normal attention and concentration ?language fluent, comprehension intact, naming intact ?fund of knowledge appropriate ? ?CRANIAL NERVE ?2nd, 3rd, 4th, 6th - pupils equal and reactive to light, visual fields full to confrontation, extraocular muscles intact, no nystagmus ?5th - facial sensation symmetric ?7th - facial strength symmetric ?8th - hearing intact ?9th - palate elevates symmetrically, uvula midline ?11th - shoulder shrug symmetric ?12th - tongue protrusion midline ? ?MOTOR:  ?normal bulk and tone, full strength in the BUE, BLE ? ?SENSORY:  ?normal and symmetric to light touch,  pinprick, temperature, vibration ? ?COORDINATION:  ?finger-nose-finger, fine finger movements normal ? ?REFLEXES:  ?deep tendon reflexes present and symmetric ? ?GAIT/STATION:  ?normal ? ? ? ? ?DIAGNOSTIC DATA (LABS, IMAGING, TESTING) ?- I reviewed patient records, labs, notes, testing and imaging myself where available. ? ?Lab Results  ?Component Value Date  ? WBC 18.0 (H) 07/23/2021  ? HGB 13.6 07/23/2021  ? HCT 40.8 07/23/2021  ? MCV 89.1 07/23/2021  ? PLT 368 07/23/2021  ? ?   ?Component Value Date/Time  ? NA 136 07/23/2021 0353  ? K 3.5 07/23/2021 0353  ? CL 109 07/23/2021 0353  ? CO2 20 (L) 07/23/2021 0353  ? GLUCOSE 87 07/23/2021 0353  ? BUN 10 07/23/2021 0353  ? CREATININE 1.04 07/23/2021 0353  ? CALCIUM 8.7 (L) 07/23/2021 0353  ? PROT 7.1 07/23/2021 0353  ?  ALBUMIN 3.9 07/23/2021 0353  ? AST 30 07/23/2021 0353  ? ALT 43 07/23/2021 0353  ? ALKPHOS 77 07/23/2021 0353  ? BILITOT 0.7 07/23/2021 0353  ? GFRNONAA >60 07/23/2021 0353  ? GFRAA NOT CALCULATED 05/12/2015 2329  ? ?No results found for: CHOL, HDL, LDLCALC, LDLDIRECT, TRIG ?No results found for: HGBA1C ?No results found for: VITAMINB12 ?No results found for: TSH ? ?MRI Brain with and without contrast 07/23/2021  ?No acute finding or visible seizure focus. ? ?EEG 07/23/2021: ?This study is within normal limits. No seizures or epileptiform discharges were seen throughout the recording.  ? ? ?ASSESSMENT AND PLAN ? ?21 y.o. year old male  with no reported past medical history who is presenting after 3 seizures in the period of 24 hours on December 29.  No seizures since last visit in January.  He is tolerating the medication well.  Plan will be to continue medication (Keppra) for a year.  I will obtain an ambulatory EEG and if it is still normal, we will discuss about coming off medication.  I will see him in 6 months, for follow-up. ? ? ?1. Seizure disorder ([place])   ? ? ? ?Patient Instructions  ?Continue Keppra 500 mg twice daily, refill given ?Follow-up in  6 months, or sooner if worse. ? ? ? ?No orders of the defined types were placed in this encounter. ? ? ?Meds ordered this encounter  ?Medications  ? levETIRAcetam (KEPPRA) 500 MG tablet  ?  Sig: Take 1

## 2022-04-18 ENCOUNTER — Other Ambulatory Visit: Payer: Self-pay | Admitting: Neurology

## 2022-04-28 ENCOUNTER — Other Ambulatory Visit: Payer: Self-pay | Admitting: Neurology

## 2022-04-28 ENCOUNTER — Telehealth: Payer: Self-pay | Admitting: Neurology

## 2022-04-28 MED ORDER — LEVETIRACETAM 1000 MG PO TABS: 1000.0000 mg | ORAL_TABLET | Freq: Two times a day (BID) | ORAL | 11 refills | Status: DC

## 2022-04-28 NOTE — Telephone Encounter (Signed)
I called pt's mother back. She states pt had a nocturnal seizure last night. He woke this morning with body aches and had bit the side of his tongue. Prior seizure was 07/22/2021.   He is currently taking Keppra 500 mg twice daily and is compliant. Mother sts he has not missed any med, no illness, sleep depravation, and stress has been minimal.   She wanted to know if any changes should be made. He is scheduled for f/u on 05/26/2022

## 2022-04-28 NOTE — Telephone Encounter (Signed)
Pt called back. Requesting a return call from nurse.

## 2022-04-28 NOTE — Progress Notes (Signed)
Patient had a breakthrough seizure last night. No provoking factors. Will increase Keppra to 1000 mg BID.

## 2022-04-28 NOTE — Telephone Encounter (Signed)
I called pt's mom back, lvm for return call.

## 2022-04-28 NOTE — Telephone Encounter (Signed)
Pt's mother called stating her son had a seizure over night being on the medication. She would like to have the Provider or RN to call her back to be advised on what is the next step.

## 2022-04-28 NOTE — Telephone Encounter (Signed)
Please advise patient/parent to increase Keppra to 1000 mg twice daily. I will send the new prescription. Thanks

## 2022-04-28 NOTE — Telephone Encounter (Addendum)
I called pt's mom back and we discussed message from Dr. Richardson Landry. Pt will increase Keppra to 1000 mg 2 times daily and keep f/u as scheduled.  She will keep [place] updated with any new questions/concnerns.

## 2022-05-26 ENCOUNTER — Ambulatory Visit: Payer: Medicaid Other | Admitting: Neurology

## 2022-05-26 ENCOUNTER — Encounter: Payer: Self-pay | Admitting: Neurology

## 2022-05-26 VITALS — BP 143/81 | HR 56 | Ht 65.0 in | Wt 280.0 lb

## 2022-05-26 DIAGNOSIS — Z5181 Encounter for therapeutic drug level monitoring: Secondary | ICD-10-CM | POA: Diagnosis not present

## 2022-05-26 DIAGNOSIS — G40909 Epilepsy, unspecified, not intractable, without status epilepticus: Secondary | ICD-10-CM

## 2022-05-26 NOTE — Patient Instructions (Addendum)
Continue with Keppra 1000 mg twice daily We will check Keppra level today 72 hours ambulatory EEG  Follow-up in 6 months or sooner if worse.

## 2022-05-26 NOTE — Progress Notes (Signed)
[facility]  PATIENT: William Koch. DOB: 08/27/2000  REQUESTING CLINICIAN: Leilani Able, MD HISTORY FROM: Patient and mother  REASON FOR VISIT: New onset seizure    HISTORICAL  CHIEF COMPLAINT:  Chief Complaint  Patient presents with   Follow-up    Rm 13 with Mother Zelma Pt is well, resorts no sz since call in 10/5 and increase in keppra     INTERVAL HISTORY 05/26/2022:  Patient presents today for follow-up, he is accompanied by his mother.  He had a breakthrough seizure on October 5.  He denies missing his Keppra dose, denies lack of sleep and could not think of any provoking factors.  Since then his Keppra has been increased to 1000 mg twice daily.  He is compliant with the medication, denies any side effect from the medication.  Reported his sleep is good and no increase stress.     INTERVAL HISTORY 11/23/2021 Patient presents today for follow-up, he is accompanied by his mom.  Denies any seizures since last visit.  He is tolerating the Keppra well.  Denies any side effect from the medication.  His last Keppra level was 9.7.   HISTORY OF PRESENT ILLNESS:  This is a 21 year old gentleman with no past medical history who is presenting after 3 seizures on December 29.  Patient report December 29 he had a seizure during sleep in the morning.  Seizure described as tonic-clonic, EMS was called and he was taken to the ED.  In the ED he was back to his normal self, he was noted to have tongue biting, no urinary incontinence and was discharged 2 hours later.  Patient went home, was taking a shower and had another seizure in the bathtub. Mom witnessed the seizure and described as tonic-clonic seizure.  He was taken back to the hospital.  Was initially evaluated, lab was drawn and he was put back on the lobby when he had a third seizure.  He was started on Keppra after the third seizure, he did have a MRI brain with and without contrast which showed no abnormality and  his EEG was also normal.  He was admitted for 24 hours, back to his baseline and discharged home with Keppra 500 mg twice daily.  Patient denies any previous history of seizures, denies any seizure risk factor, denies any history of head trauma.  Since being home, he has been doing well, no additional seizures.  He reports compliance with medication and denies any side effect from the medication.   Handedness: right handed   Onset: July 22 2020  Seizure Type: GTC   Current frequency: First time   Any injuries from seizures: None   Seizure risk factors: None reported, paternal grandmother with seizure  Previous ASMs: Levetiracetam   Currenty ASMs: Levetiracetam 1000 mg BID   ASMs side effects: None reported   Brain Images: No acute abnormalities   Previous EEGs: Normal EEG   OTHER MEDICAL CONDITIONS: None reported   REVIEW OF SYSTEMS: Full 14 system review of systems performed and negative with exception of: as noted in the HPI  ALLERGIES: Allergies  Allergen Reactions   Latex Rash    Burning of skin.    HOME MEDICATIONS: Outpatient Medications Prior to Visit  Medication Sig Dispense Refill   acetaminophen (TYLENOL) 500 MG tablet Take 500 mg by mouth every 6 (six) hours as needed for headache.     levETIRAcetam (KEPPRA) 1000 MG tablet Take 1 tablet (1,000 mg total) by mouth 2 (  two) times daily. 60 tablet 11   Midazolam (NAYZILAM) 5 MG/0.1ML SOLN Place 5 mg into the nose as needed (for seizure lasting more than 5 min). 1 each 2   No facility-administered medications prior to visit.    PAST MEDICAL HISTORY: History reviewed. No pertinent past medical history.  PAST SURGICAL HISTORY: Past Surgical History:  Procedure Laterality Date   LEG AMPUTATION BELOW KNEE      FAMILY HISTORY: Family History  Problem Relation Age of Onset   Diabetes Father    Hypertension Father    Cancer Father    Stroke Other    Seizures Paternal Grandmother    Seizures Paternal  Aunt    Febrile seizures Cousin     SOCIAL HISTORY: Social History   Socioeconomic History   Marital status: Significant Other    Spouse name: Not on file   Number of children: Not on file   Years of education: Not on file   Highest education level: Not on file  Occupational History   Not on file  Tobacco Use   Smoking status: Never   Smokeless tobacco: Not on file  Substance and Sexual Activity   Alcohol use: No   Drug use: No   Sexual activity: Not on file  Other Topics Concern   Not on file  Social History Narrative   Not on file   Social Determinants of Health   Financial Resource Strain: Not on file  Food Insecurity: Not on file  Transportation Needs: Not on file  Physical Activity: Not on file  Stress: Not on file  Social Connections: Not on file  Intimate Partner Violence: Not on file    PHYSICAL EXAM  GENERAL EXAM/CONSTITUTIONAL: Vitals:  Vitals:   05/26/22 1110  BP: (!) 143/81  Pulse: (!) 56  Weight: 280 lb (127 kg)  Height: 5\' 5"  (1.651 m)    Body mass index is 46.59 kg/m. Wt Readings from Last 3 Encounters:  05/26/22 280 lb (127 kg)  11/23/21 299 lb (135.6 kg)  08/27/21 300 lb (136.1 kg)   Patient is in no distress; well developed, nourished and groomed; neck is supple  CARDIOVASCULAR: Examination of carotid arteries is normal; no carotid bruits Regular rate and rhythm, no murmurs Examination of peripheral vascular system by observation and palpation is normal  EYES: Pupils round and reactive to light, Visual fields full to confrontation, Extraocular movements intacts,  No results found.  MUSCULOSKELETAL: Gait, strength, tone, movements noted in Neurologic exam below  NEUROLOGIC: MENTAL STATUS:      No data to display         awake, alert, oriented to person, place and time recent and remote memory intact normal attention and concentration language fluent, comprehension intact, naming intact fund of knowledge  appropriate  CRANIAL NERVE 2nd, 3rd, 4th, 6th - pupils equal and reactive to light, visual fields full to confrontation, extraocular muscles intact, no nystagmus 5th - facial sensation symmetric 7th - facial strength symmetric 8th - hearing intact 9th - palate elevates symmetrically, uvula midline 11th - shoulder shrug symmetric 12th - tongue protrusion midline  MOTOR:  normal bulk and tone, full strength in the BUE, BLE  SENSORY:  normal and symmetric to light touch, pinprick, temperature, vibration  COORDINATION:  finger-nose-finger, fine finger movements normal  REFLEXES:  deep tendon reflexes present and symmetric  GAIT/STATION:  normal     DIAGNOSTIC DATA (LABS, IMAGING, TESTING) - I reviewed patient records, labs, notes, testing and imaging myself where available.  Lab Results  Component Value Date   WBC 18.0 (H) 07/23/2021   HGB 13.6 07/23/2021   HCT 40.8 07/23/2021   MCV 89.1 07/23/2021   PLT 368 07/23/2021      Component Value Date/Time   NA 136 07/23/2021 0353   K 3.5 07/23/2021 0353   CL 109 07/23/2021 0353   CO2 20 (L) 07/23/2021 0353   GLUCOSE 87 07/23/2021 0353   BUN 10 07/23/2021 0353   CREATININE 1.04 07/23/2021 0353   CALCIUM 8.7 (L) 07/23/2021 0353   PROT 7.1 07/23/2021 0353   ALBUMIN 3.9 07/23/2021 0353   AST 30 07/23/2021 0353   ALT 43 07/23/2021 0353   ALKPHOS 77 07/23/2021 0353   BILITOT 0.7 07/23/2021 0353   GFRNONAA >60 07/23/2021 0353   GFRAA NOT CALCULATED 05/12/2015 2329   No results found for: "CHOL", "HDL", "LDLCALC", "LDLDIRECT", "TRIG" No results found for: "HGBA1C" No results found for: "VITAMINB12" No results found for: "TSH"  MRI Brain with and without contrast 07/23/2021  No acute finding or visible seizure focus.  EEG 07/23/2021: This study is within normal limits. No seizures or epileptiform discharges were seen throughout the recording.    ASSESSMENT AND PLAN  21 y.o. year old male  with no reported past  medical history who is presenting after 3 seizures in the period of 24 hours on December 29.  He then had ab breakthrough seizure on October 5th while on Keppra 500 mg BID. Keppra increase to 1000 mg BID and he is tolerating the medication well, no side effect. We will obtain a Keppra level, continue him on Keppra 1000 mg and also obtain an ambulatory EEG. Patient and mother comfortable with plans. I will contact him to go over the results. Follow up in 6 months.    1. Seizure disorder ([place])   2. Therapeutic drug monitoring      Patient Instructions  Continue with Keppra 1000 mg twice daily We will check Keppra level today 72 hours ambulatory EEG  Follow-up in 6 months or sooner if worse.    Orders Placed This Encounter  Procedures   Levetiracetam level   AMBULATORY EEG    No orders of the defined types were placed in this encounter.   Return in about 6 months (around 11/24/2022).    Alric Ran, MD 05/26/2022, 1:18 PM  [facility] [address] [place], [place] [address] [phone]

## 2022-05-29 LAB — LEVETIRACETAM LEVEL: Levetiracetam Lvl: 28.5 ug/mL (ref 10.0–40.0)

## 2022-09-14 ENCOUNTER — Encounter: Payer: Self-pay | Admitting: Neurology

## 2022-09-14 ENCOUNTER — Ambulatory Visit: Payer: Medicaid Other | Admitting: Neurology

## 2022-09-14 VITALS — BP 165/82 | HR 66 | Ht 65.0 in | Wt 280.0 lb

## 2022-09-14 DIAGNOSIS — G40909 Epilepsy, unspecified, not intractable, without status epilepticus: Secondary | ICD-10-CM

## 2022-09-14 NOTE — Progress Notes (Signed)
[facility]  PATIENT: William Koch. DOB: [birth year]  REQUESTING CLINICIAN: Lin Landsman, MD HISTORY FROM: Patient and mother  REASON FOR VISIT: New onset seizure    HISTORICAL  CHIEF COMPLAINT:  Chief Complaint  Patient presents with   Follow-up    Rm 12 Seizure fu, states he is stable, no seizures    INTERVAL HISTORY 09/14/2022:  Patient presents today for follow-up, he is accompanied by his girlfriend.  Last visit was in November.  At that time plan was to continue with Keppra 1000 mg twice daily.  He reports compliance with his medicine, denies any side effect from the medicine and no seizure.  Overall he is doing very well.   INTERVAL HISTORY 05/26/2022:  Patient presents today for follow-up, he is accompanied by his mother.  He had a breakthrough seizure on October 5.  He denies missing his Keppra dose, denies lack of sleep and could not think of any provoking factors.  Since then his Keppra has been increased to 1000 mg twice daily.  He is compliant with the medication, denies any side effect from the medication.  Reported his sleep is good and no increase stress.     INTERVAL HISTORY 11/23/2021 Patient presents today for follow-up, he is accompanied by his mom.  Denies any seizures since last visit.  He is tolerating the Keppra well.  Denies any side effect from the medication.  His last Keppra level was 9.7.   HISTORY OF PRESENT ILLNESS:  This is a 22 year old gentleman with no past medical history who is presenting after 3 seizures on December 29.  Patient report December 29 he had a seizure during sleep in the morning.  Seizure described as tonic-clonic, EMS was called and he was taken to the ED.  In the ED he was back to his normal self, he was noted to have tongue biting, no urinary incontinence and was discharged 2 hours later.  Patient went home, was taking a shower and had another seizure in the bathtub. Mom witnessed the seizure and described  as tonic-clonic seizure.  He was taken back to the hospital.  Was initially evaluated, lab was drawn and he was put back on the lobby when he had a third seizure.  He was started on Keppra after the third seizure, he did have a MRI brain with and without contrast which showed no abnormality and his EEG was also normal.  He was admitted for 24 hours, back to his baseline and discharged home with Keppra 500 mg twice daily.  Patient denies any previous history of seizures, denies any seizure risk factor, denies any history of head trauma.  Since being home, he has been doing well, no additional seizures.  He reports compliance with medication and denies any side effect from the medication.   Handedness: right handed   Onset: July 22 2020  Seizure Type: GTC   Current frequency: First time   Any injuries from seizures: None   Seizure risk factors: None reported, paternal grandmother with seizure  Previous ASMs: Levetiracetam   Currenty ASMs: Levetiracetam 1000 mg BID   ASMs side effects: None reported   Brain Images: No acute abnormalities   Previous EEGs: Normal EEG   OTHER MEDICAL CONDITIONS: None reported   REVIEW OF SYSTEMS: Full 14 system review of systems performed and negative with exception of: as noted in the HPI  ALLERGIES: Allergies  Allergen Reactions   Latex Rash    Burning of skin.  HOME MEDICATIONS: Outpatient Medications Prior to Visit  Medication Sig Dispense Refill   acetaminophen (TYLENOL) 500 MG tablet Take 500 mg by mouth every 6 (six) hours as needed for headache.     levETIRAcetam (KEPPRA) 1000 MG tablet Take 1 tablet (1,000 mg total) by mouth 2 (two) times daily. 60 tablet 11   Midazolam (NAYZILAM) 5 MG/0.1ML SOLN Place 5 mg into the nose as needed (for seizure lasting more than 5 min). 1 each 2   No facility-administered medications prior to visit.    PAST MEDICAL HISTORY: History reviewed. No pertinent past medical history.  PAST SURGICAL  HISTORY: Past Surgical History:  Procedure Laterality Date   LEG AMPUTATION BELOW KNEE      FAMILY HISTORY: Family History  Problem Relation Age of Onset   Diabetes Father    Hypertension Father    Cancer Father    Stroke Other    Seizures Paternal Grandmother    Seizures Paternal Aunt    Febrile seizures Cousin     SOCIAL HISTORY: Social History   Socioeconomic History   Marital status: Significant Other    Spouse name: Not on file   Number of children: Not on file   Years of education: Not on file   Highest education level: Not on file  Occupational History   Not on file  Tobacco Use   Smoking status: Never   Smokeless tobacco: Not on file  Substance and Sexual Activity   Alcohol use: No   Drug use: No   Sexual activity: Not on file  Other Topics Concern   Not on file  Social History Narrative   Not on file   Social Determinants of Health   Financial Resource Strain: Not on file  Food Insecurity: Not on file  Transportation Needs: Not on file  Physical Activity: Not on file  Stress: Not on file  Social Connections: Not on file  Intimate Partner Violence: Not on file    PHYSICAL EXAM  GENERAL EXAM/CONSTITUTIONAL: Vitals:  Vitals:   09/14/22 1549  BP: (!) 165/82  Pulse: 66  Weight: 280 lb (127 kg)  Height: 5' 5"$  (1.651 m)     Body mass index is 46.59 kg/m. Wt Readings from Last 3 Encounters:  09/14/22 280 lb (127 kg)  05/26/22 280 lb (127 kg)  11/23/21 299 lb (135.6 kg)   Patient is in no distress; well developed, nourished and groomed; neck is supple  EYES: Visual fields full to confrontation, Extraocular movements intacts,  No results found.  MUSCULOSKELETAL: Gait, strength, tone, movements noted in Neurologic exam below  NEUROLOGIC: MENTAL STATUS:      No data to display         awake, alert, oriented to person, place and time recent and remote memory intact normal attention and concentration language fluent, comprehension  intact, naming intact fund of knowledge appropriate  CRANIAL NERVE 2nd, 3rd, 4th, 6th - visual fields full to confrontation, extraocular muscles intact, no nystagmus 5th - facial sensation symmetric 7th - facial strength symmetric 8th - hearing intact 9th - palate elevates symmetrically, uvula midline 11th - shoulder shrug symmetric 12th - tongue protrusion midline  MOTOR:  normal bulk and tone, full strength in the BUE, BLE  SENSORY:  normal and symmetric to light touch, pinprick, temperature, vibration  COORDINATION:  finger-nose-finger, fine finger movements normal  REFLEXES:  deep tendon reflexes present and symmetric  GAIT/STATION:  normal   DIAGNOSTIC DATA (LABS, IMAGING, TESTING) - I reviewed patient records, labs,  notes, testing and imaging myself where available.  Lab Results  Component Value Date   WBC 18.0 (H) 07/23/2021   HGB 13.6 07/23/2021   HCT 40.8 07/23/2021   MCV 89.1 07/23/2021   PLT 368 07/23/2021      Component Value Date/Time   NA 136 07/23/2021 0353   K 3.5 07/23/2021 0353   CL 109 07/23/2021 0353   CO2 20 (L) 07/23/2021 0353   GLUCOSE 87 07/23/2021 0353   BUN 10 07/23/2021 0353   CREATININE 1.04 07/23/2021 0353   CALCIUM 8.7 (L) 07/23/2021 0353   PROT 7.1 07/23/2021 0353   ALBUMIN 3.9 07/23/2021 0353   AST 30 07/23/2021 0353   ALT 43 07/23/2021 0353   ALKPHOS 77 07/23/2021 0353   BILITOT 0.7 07/23/2021 0353   GFRNONAA >60 07/23/2021 0353   GFRAA NOT CALCULATED 05/12/2015 2329   No results found for: "CHOL", "HDL", "LDLCALC", "LDLDIRECT", "TRIG" No results found for: "HGBA1C" No results found for: "VITAMINB12" No results found for: "TSH"  MRI Brain with and without contrast 07/23/2021  No acute finding or visible seizure focus.  EEG 07/23/2021: This study is within normal limits. No seizures or epileptiform discharges were seen throughout the recording.    ASSESSMENT AND PLAN  22 y.o. year old male  with no reported past  medical history who is presenting after 3 seizures in the period of 24 hours on December 29.  He then had ab breakthrough seizure on October 5th while on Keppra 500 mg BID. Keppra increase to 1000 mg BID and he is tolerating the medication well, no side effect.  His latest Keppra level was normal at 29.  At this time I will continue patient on Keppra 1000 mg twice daily and we will see him in 1 year for follow-up.  Return sooner if worse.   1. Seizure disorder [facility])     Patient Instructions  Continue levetiracetam 1000 mg twice daily Continue your other medications Follow-up in 1 year or sooner if worse.  Please do contact us if you do have a breakthrough seizure.    No orders of the defined types were placed in this encounter.   No orders of the defined types were placed in this encounter.   Return in about 1 year (around 09/15/2023).  I have spent a total of 30 minutes dedicated to this patient today, preparing to see patient, performing a medically appropriate examination and evaluation, ordering tests and/or medications and procedures, and counseling and educating the patient/family/caregiver; independently interpreting result and communicating results to the family/patient/caregiver; and documenting clinical information in the electronic medical record.   Alric Ran, MD 09/14/2022, 4:03 PM  [facility] [address] [place], [place] [address] [phone]

## 2022-09-14 NOTE — Patient Instructions (Signed)
Continue levetiracetam 1000 mg twice daily Continue your other medications Follow-up in 1 year or sooner if worse.  Please do contact us if you do have a breakthrough seizure.

## 2023-06-02 ENCOUNTER — Other Ambulatory Visit: Payer: Self-pay | Admitting: Neurology

## 2023-09-14 ENCOUNTER — Ambulatory Visit: Payer: Medicaid Other | Admitting: Neurology

## 2023-09-28 ENCOUNTER — Emergency Department (HOSPITAL_BASED_OUTPATIENT_CLINIC_OR_DEPARTMENT_OTHER): Admitting: Radiology

## 2023-09-28 ENCOUNTER — Other Ambulatory Visit: Payer: Self-pay

## 2023-09-28 ENCOUNTER — Emergency Department (HOSPITAL_BASED_OUTPATIENT_CLINIC_OR_DEPARTMENT_OTHER)
Admission: EM | Admit: 2023-09-28 | Discharge: 2023-09-28 | Discharge status: Against Medical Advice | Attending: Emergency Medicine | Admitting: Emergency Medicine

## 2023-09-28 ENCOUNTER — Emergency Department (HOSPITAL_BASED_OUTPATIENT_CLINIC_OR_DEPARTMENT_OTHER)

## 2023-09-28 DIAGNOSIS — Y92410 Unspecified street and highway as the place of occurrence of the external cause: Secondary | ICD-10-CM | POA: Insufficient documentation

## 2023-09-28 DIAGNOSIS — Z5321 Procedure and treatment not carried out due to patient leaving prior to being seen by health care provider: Secondary | ICD-10-CM | POA: Insufficient documentation

## 2023-09-28 DIAGNOSIS — M542 Cervicalgia: Secondary | ICD-10-CM | POA: Diagnosis present

## 2023-09-28 DIAGNOSIS — M545 Low back pain, unspecified: Secondary | ICD-10-CM | POA: Diagnosis not present
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
## 2023-09-28 NOTE — ED Notes (Signed)
Placed in c-collar.

## 2024-01-22 ENCOUNTER — Encounter: Payer: Self-pay | Admitting: Neurology

## 2024-01-22 ENCOUNTER — Ambulatory Visit: Payer: Medicaid Other | Admitting: Neurology

## 2024-01-22 VITALS — BP 165/82 | HR 68 | Resp 15 | Ht 67.0 in | Wt 260.0 lb

## 2024-01-22 DIAGNOSIS — G40909 Epilepsy, unspecified, not intractable, without status epilepticus: Secondary | ICD-10-CM

## 2024-01-22 MED ORDER — LEVETIRACETAM 1000 MG PO TABS: 1000.0000 mg | ORAL_TABLET | Freq: Two times a day (BID) | ORAL | 3 refills | Status: AC

## 2024-01-22 NOTE — Progress Notes (Signed)
 [facility]  PATIENT: William Koch. DOB: 2000/08/11  REQUESTING CLINICIAN: Ilah Crigler, MD HISTORY FROM: Patient and mother  REASON FOR VISIT: New onset seizure    HISTORICAL  CHIEF COMPLAINT:  Chief Complaint  Patient presents with   Seizures    Rm13, girlfriend present, sz annual followup: no recent sz, well controlled no concerns   INTERVAL HISTORY 01/22/2024:  Patient presents today for follow-up, he is accompanied by girlfriend.  Last visit was in February  2024.  Since then, he denies any seizure or seizure like activity.  He reports compliance with the medication and no side effects.  Currently he has no complaints or concerns.  Overall he is doing very well.  He started a new job working at the [employer] facility.   INTERVAL HISTORY 09/14/2022:  Patient presents today for follow-up, he is accompanied by his girlfriend.  Last visit was in November.  At that time plan was to continue with Keppra  1000 mg twice daily.  He reports compliance with his medicine, denies any side effect from the medicine and no seizure.  Overall he is doing very well.   INTERVAL HISTORY 05/26/2022:  Patient presents today for follow-up, he is accompanied by his mother.  He had a breakthrough seizure on October 5.  He denies missing his Keppra  dose, denies lack of sleep and could not think of any provoking factors.  Since then his Keppra  has been increased to 1000 mg twice daily.  He is compliant with the medication, denies any side effect from the medication.  Reported his sleep is good and no increase stress.     INTERVAL HISTORY 11/23/2021 Patient presents today for follow-up, he is accompanied by his mom.  Denies any seizures since last visit.  He is tolerating the Keppra  well.  Denies any side effect from the medication.  His last Keppra  level was 9.7.   HISTORY OF PRESENT ILLNESS:  This is a 23 year old gentleman with no past medical history who is  presenting after 3 seizures on December 29.  Patient report December 29 he had a seizure during sleep in the morning.  Seizure described as tonic-clonic, EMS was called and he was taken to the ED.  In the ED he was back to his normal self, he was noted to have tongue biting, no urinary incontinence and was discharged 2 hours later.  Patient went home, was taking a shower and had another seizure in the bathtub. Mom witnessed the seizure and described as tonic-clonic seizure.  He was taken back to the hospital.  Was initially evaluated, lab was drawn and he was put back on the lobby when he had a third seizure.  He was started on Keppra  after the third seizure, he did have a MRI brain with and without contrast which showed no abnormality and his EEG was also normal.  He was admitted for 24 hours, back to his baseline and discharged home with Keppra  500 mg twice daily.  Patient denies any previous history of seizures, denies any seizure risk factor, denies any history of head trauma.  Since being home, he has been doing well, no additional seizures.  He reports compliance with medication and denies any side effect from the medication.   Handedness: right handed   Onset: July 22 2020  Seizure Type: GTC   Current frequency: First time   Any injuries from seizures: None   Seizure risk factors: None reported, paternal grandmother with seizure  Previous ASMs: Levetiracetam   Currenty ASMs: Levetiracetam  1000 mg BID   ASMs side effects: None reported   Brain Images: No acute abnormalities   Previous EEGs: Normal EEG   OTHER MEDICAL CONDITIONS: None reported   REVIEW OF SYSTEMS: Full 14 system review of systems performed and negative with exception of: as noted in the HPI  ALLERGIES: Allergies  Allergen Reactions   Latex Rash    Burning of skin.    HOME MEDICATIONS: Outpatient Medications Prior to Visit  Medication Sig Dispense Refill   acetaminophen  (TYLENOL ) 500 MG tablet Take 500  mg by mouth every 6 (six) hours as needed for headache.     Midazolam  (NAYZILAM ) 5 MG/0.1ML SOLN Place 5 mg into the nose as needed (for seizure lasting more than 5 min). 1 each 2   levETIRAcetam  (KEPPRA ) 1000 MG tablet TAKE 1 TABLET(1000 MG) BY MOUTH TWICE DAILY 60 tablet 11   No facility-administered medications prior to visit.    PAST MEDICAL HISTORY: History reviewed. No pertinent past medical history.  PAST SURGICAL HISTORY: Past Surgical History:  Procedure Laterality Date   LEG AMPUTATION BELOW KNEE      FAMILY HISTORY: Family History  Problem Relation Age of Onset   Diabetes Father    Hypertension Father    Cancer Father    Stroke Other    Seizures Paternal Grandmother    Seizures Paternal Aunt    Febrile seizures Cousin     SOCIAL HISTORY: Social History   Socioeconomic History   Marital status: Significant Other    Spouse name: Scientist, clinical (histocompatibility and immunogenetics)   Number of children: 0   Years of education: Not on file   Highest education level: Some college, no degree  Occupational History   Not on file  Tobacco Use   Smoking status: Never    Passive exposure: Never   Smokeless tobacco: Not on file  Vaping Use   Vaping status: Some Days   Substances: THC  Substance and Sexual Activity   Alcohol use: Yes    Alcohol/week: 3.0 standard drinks of alcohol    Types: 3 Standard drinks or equivalent per week   Drug use: Yes    Comment: thc   Sexual activity: Yes    Birth control/protection: None  Other Topics Concern   Not on file  Social History Narrative   Not on file   Social Drivers of Health   Financial Resource Strain: Not on file  Food Insecurity: Not on file  Transportation Needs: Not on file  Physical Activity: Not on file  Stress: Not on file  Social Connections: Unknown (12/05/2021)   [facility]   Social Network    Social Network: Not on file  Intimate Partner Violence: Unknown (10/27/2021)   Received from [facility]   HITS     Physically Hurt: Not on file    Insult or Talk Down To: Not on file    Threaten Physical Harm: Not on file    Scream or Curse: Not on file    PHYSICAL EXAM  GENERAL EXAM/CONSTITUTIONAL: Vitals:  Vitals:   01/22/24 1107 01/22/24 1112  BP: (!) 164/93 (!) 165/82  Pulse: 68   Resp: 15   Weight: 260 lb (117.9 kg)   Height: 5' 7 (1.702 m)      Body mass index is 40.72 kg/m. Wt Readings from Last 3 Encounters:  01/22/24 260 lb (117.9 kg)  09/28/23 252 lb (114.3 kg)  09/14/22 280 lb (127 kg)   Patient is in no distress;  well developed, nourished and groomed; neck is supple  MUSCULOSKELETAL: Gait, strength, tone, movements noted in Neurologic exam below  NEUROLOGIC: MENTAL STATUS:      No data to display         awake, alert, oriented to person, place and time recent and remote memory intact normal attention and concentration language fluent, comprehension intact, naming intact fund of knowledge appropriate  CRANIAL NERVE 2nd, 3rd, 4th, 6th - visual fields full to confrontation, extraocular muscles intact, no nystagmus 5th - facial sensation symmetric 7th - facial strength symmetric 8th - hearing intact 9th - palate elevates symmetrically, uvula midline 11th - shoulder shrug symmetric 12th - tongue protrusion midline  MOTOR:  normal bulk and tone, full strength in the BUE, BLE. He has a left BKA  SENSORY:  normal and symmetric to light touch  COORDINATION:  finger-nose-finger, fine finger movements normal  GAIT/STATION:  normal   DIAGNOSTIC DATA (LABS, IMAGING, TESTING) - I reviewed patient records, labs, notes, testing and imaging myself where available.  Lab Results  Component Value Date   WBC 18.0 (H) 07/23/2021   HGB 13.6 07/23/2021   HCT 40.8 07/23/2021   MCV 89.1 07/23/2021   PLT 368 07/23/2021      Component Value Date/Time   NA 136 07/23/2021 0353   K 3.5 07/23/2021 0353   CL 109 07/23/2021 0353   CO2 20 (L) 07/23/2021 0353    GLUCOSE 87 07/23/2021 0353   BUN 10 07/23/2021 0353   CREATININE 1.04 07/23/2021 0353   CALCIUM 8.7 (L) 07/23/2021 0353   PROT 7.1 07/23/2021 0353   ALBUMIN 3.9 07/23/2021 0353   AST 30 07/23/2021 0353   ALT 43 07/23/2021 0353   ALKPHOS 77 07/23/2021 0353   BILITOT 0.7 07/23/2021 0353   GFRNONAA >60 07/23/2021 0353   GFRAA NOT CALCULATED 05/12/2015 2329   No results found for: CHOL, HDL, LDLCALC, LDLDIRECT, TRIG No results found for: HGBA1C No results found for: VITAMINB12 No results found for: TSH  MRI Brain with and without contrast 07/23/2021  No acute finding or visible seizure focus.  EEG 07/23/2021: This study is within normal limits. No seizures or epileptiform discharges were seen throughout the recording.    ASSESSMENT AND PLAN  23 y.o. year old male  with no reported past medical history who is presenting follow-up for his epilepsy.  He is doing well on Keppra  1000 mg twice daily, denies any seizure or seizure like activity.  His last Keppra  level was normal at 29.  Plan will be for patient to continue with Keppra  1000 mg twice daily, he will continue his other medications and I will see him in 1 year for follow-up or sooner if worse.    1. Nonintractable epilepsy without status epilepticus, unspecified epilepsy type [facility])      Patient Instructions  Continue levetiracetam  1000 mg twice daily, Refill given Continue your other medication Follow-up in 1 year or sooner if worse.    No orders of the defined types were placed in this encounter.   Meds ordered this encounter  Medications   levETIRAcetam  (KEPPRA ) 1000 MG tablet    Sig: Take 1 tablet (1,000 mg total) by mouth 2 (two) times daily.    Dispense:  180 tablet    Refill:  3    Return in about 1 year (around 01/21/2025).   Pastor Falling, MD 01/22/2024, 1:20 PM  [facility] [address], [address] [place], [place] [address] [phone]

## 2024-01-22 NOTE — Patient Instructions (Signed)
 Continue levetiracetam  1000 mg twice daily, Refill given Continue your other medication Follow-up in 1 year or sooner if worse.

## 2024-02-15 IMAGING — CR DG HAND COMPLETE 3+V*L*
3 series · 3 of 3 positions shown · non-contrast
Comparison: None.

CLINICAL DATA: Bilateral hand and wrist pain.

EXAM:
LEFT HAND - COMPLETE 3+ VIEW

[x hand pa left]
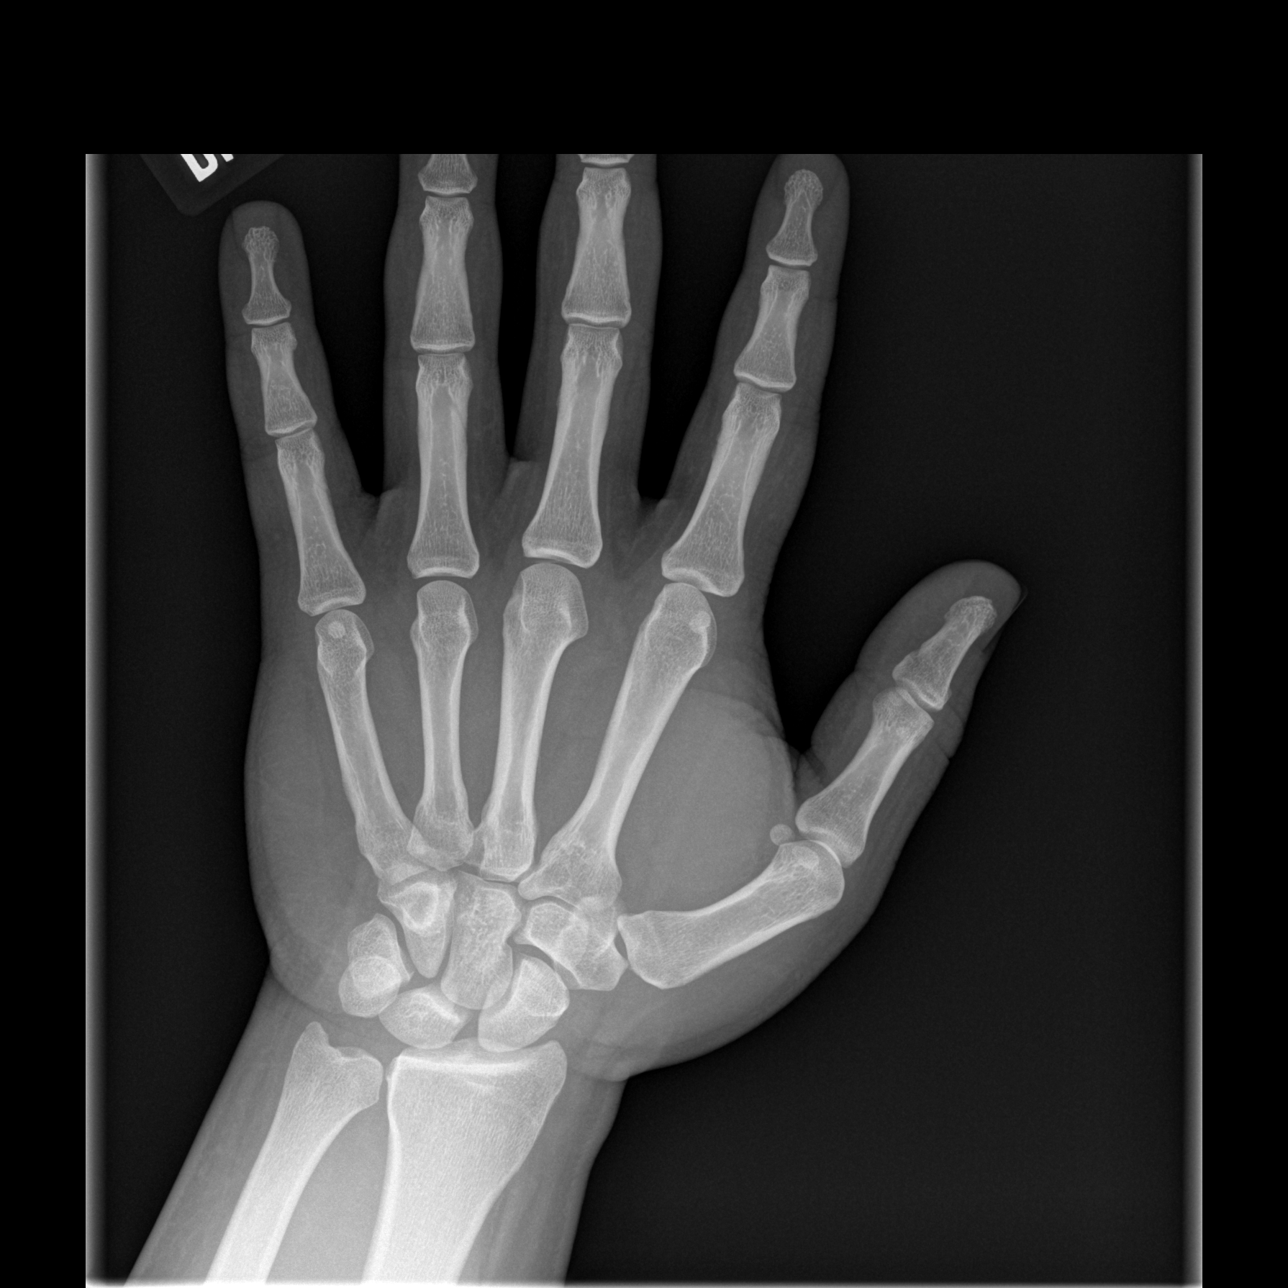

[x hand oblique left]
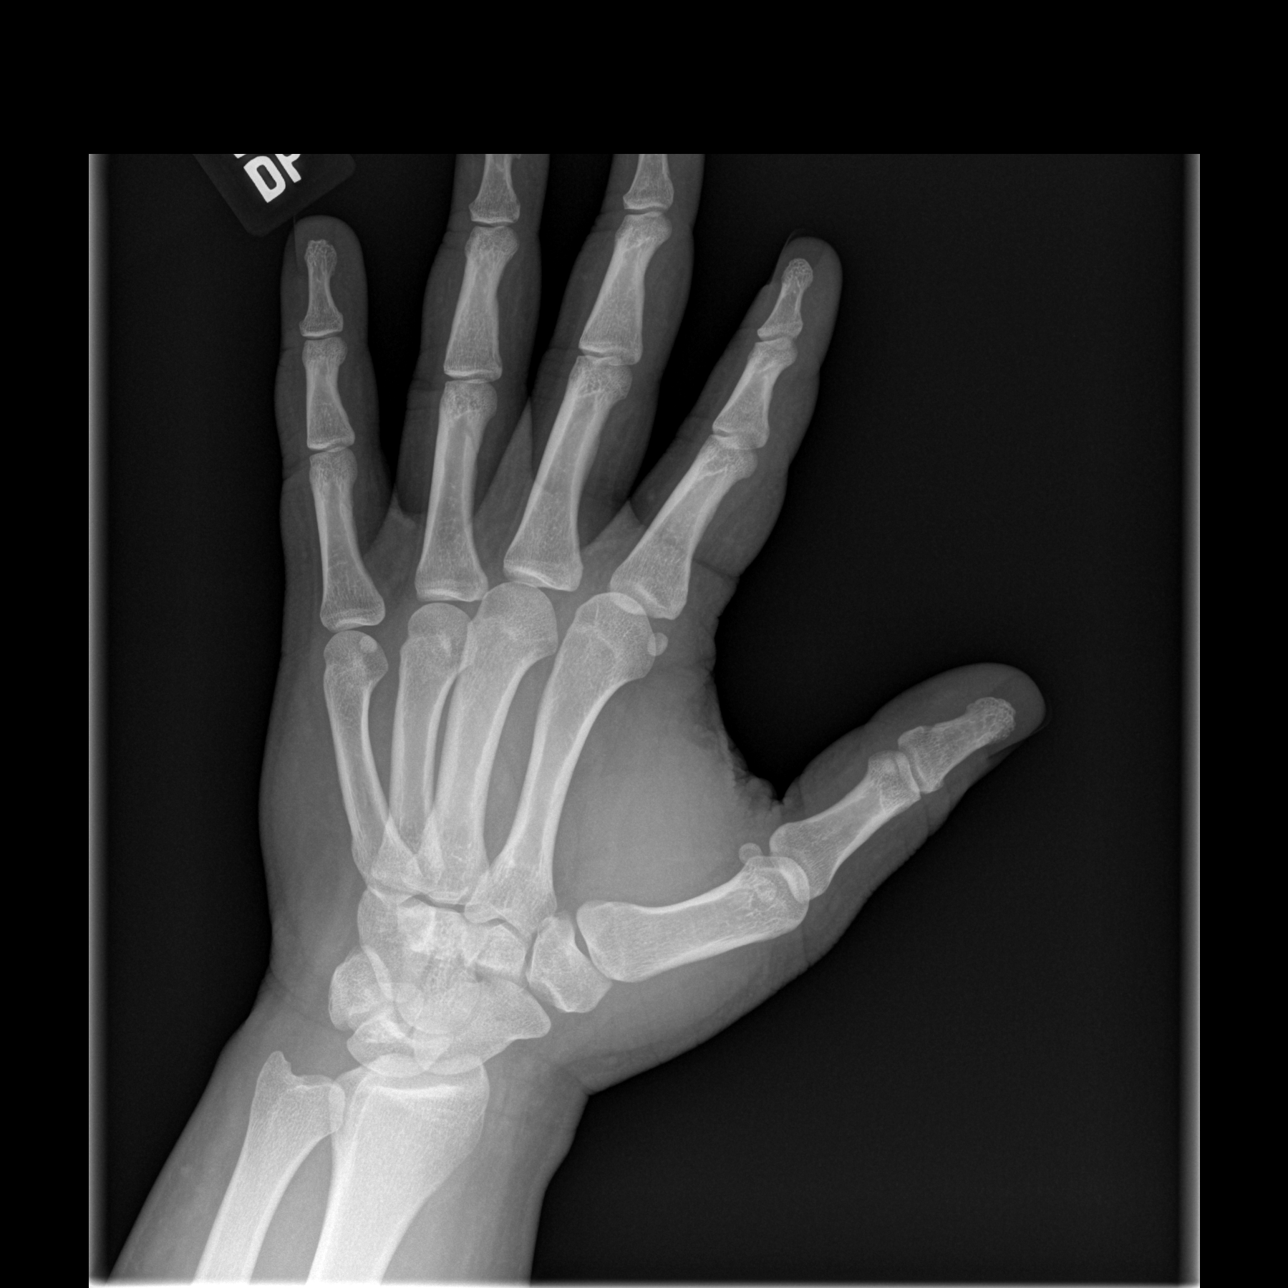

[x hand lat left]
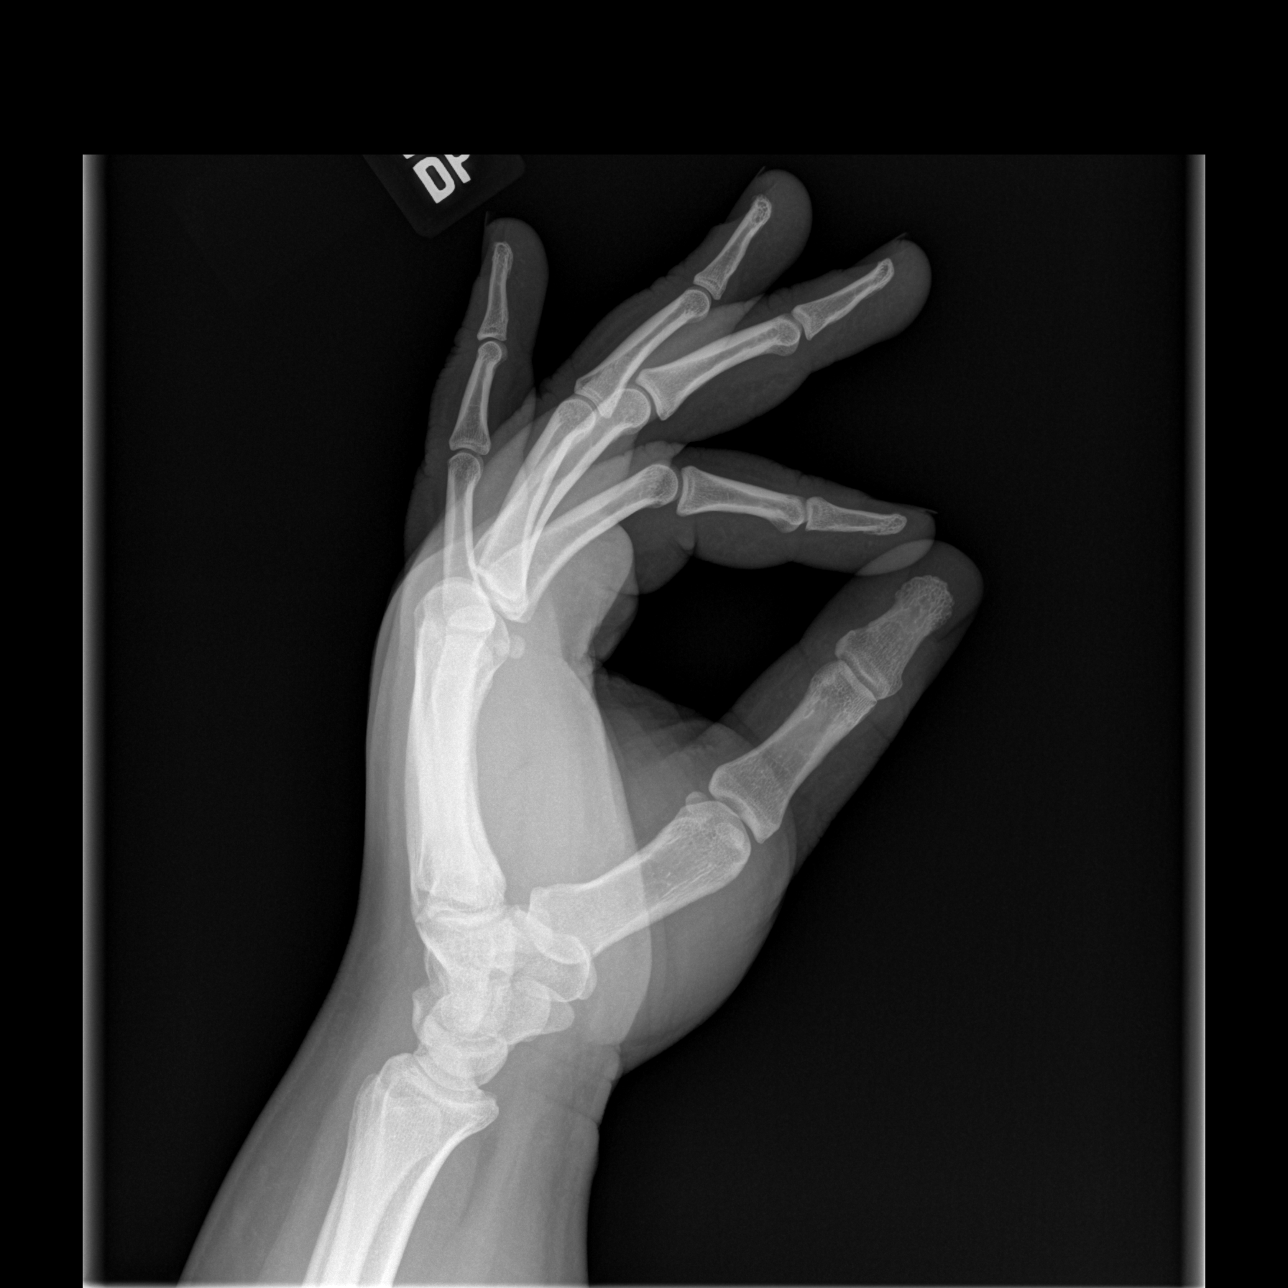

[3 of 3 positions shown; findings below may reference images not displayed]

FINDINGS: Normal bone mineralization. Joint spaces are preserved. No acute
fracture or dislocation. The cortices are intact.
IMPRESSION: Normal left hand radiographs.

## 2024-02-15 IMAGING — CR DG HAND COMPLETE 3+V*R*
3 series · 3 of 3 positions shown · non-contrast
Comparison: None.

CLINICAL DATA: Bilateral hand and wrist pain.

EXAM:
RIGHT HAND - COMPLETE 3+ VIEW

[x hand pa right]
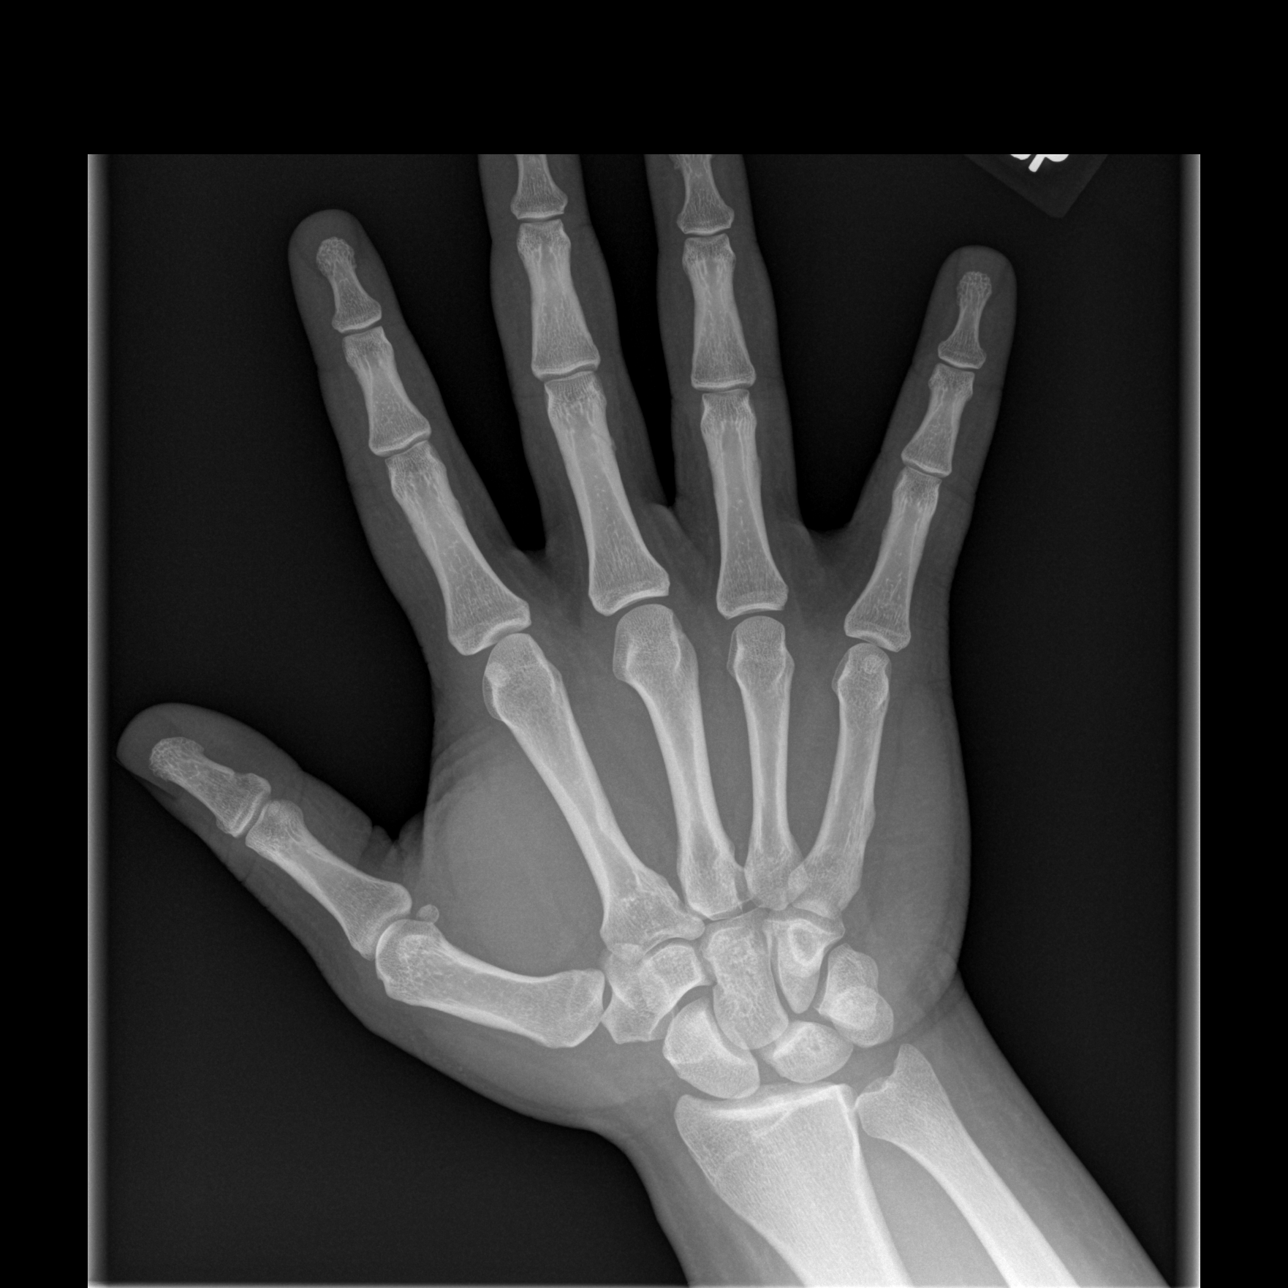

[x hand oblique right]
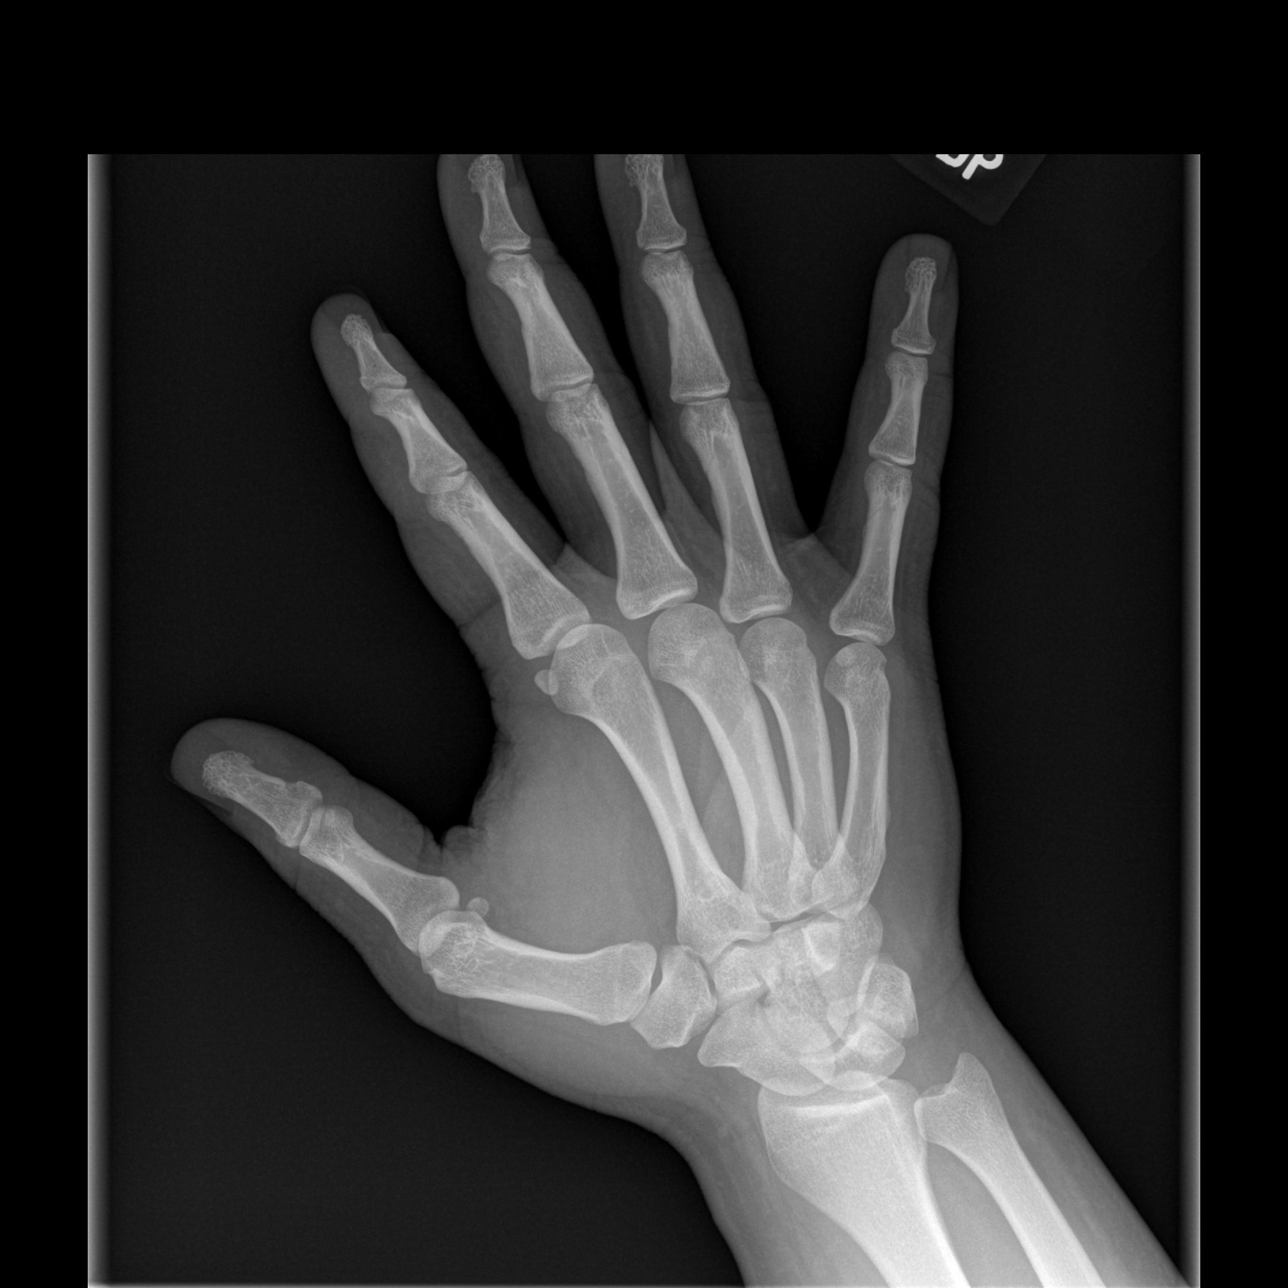

[x hand lat right]
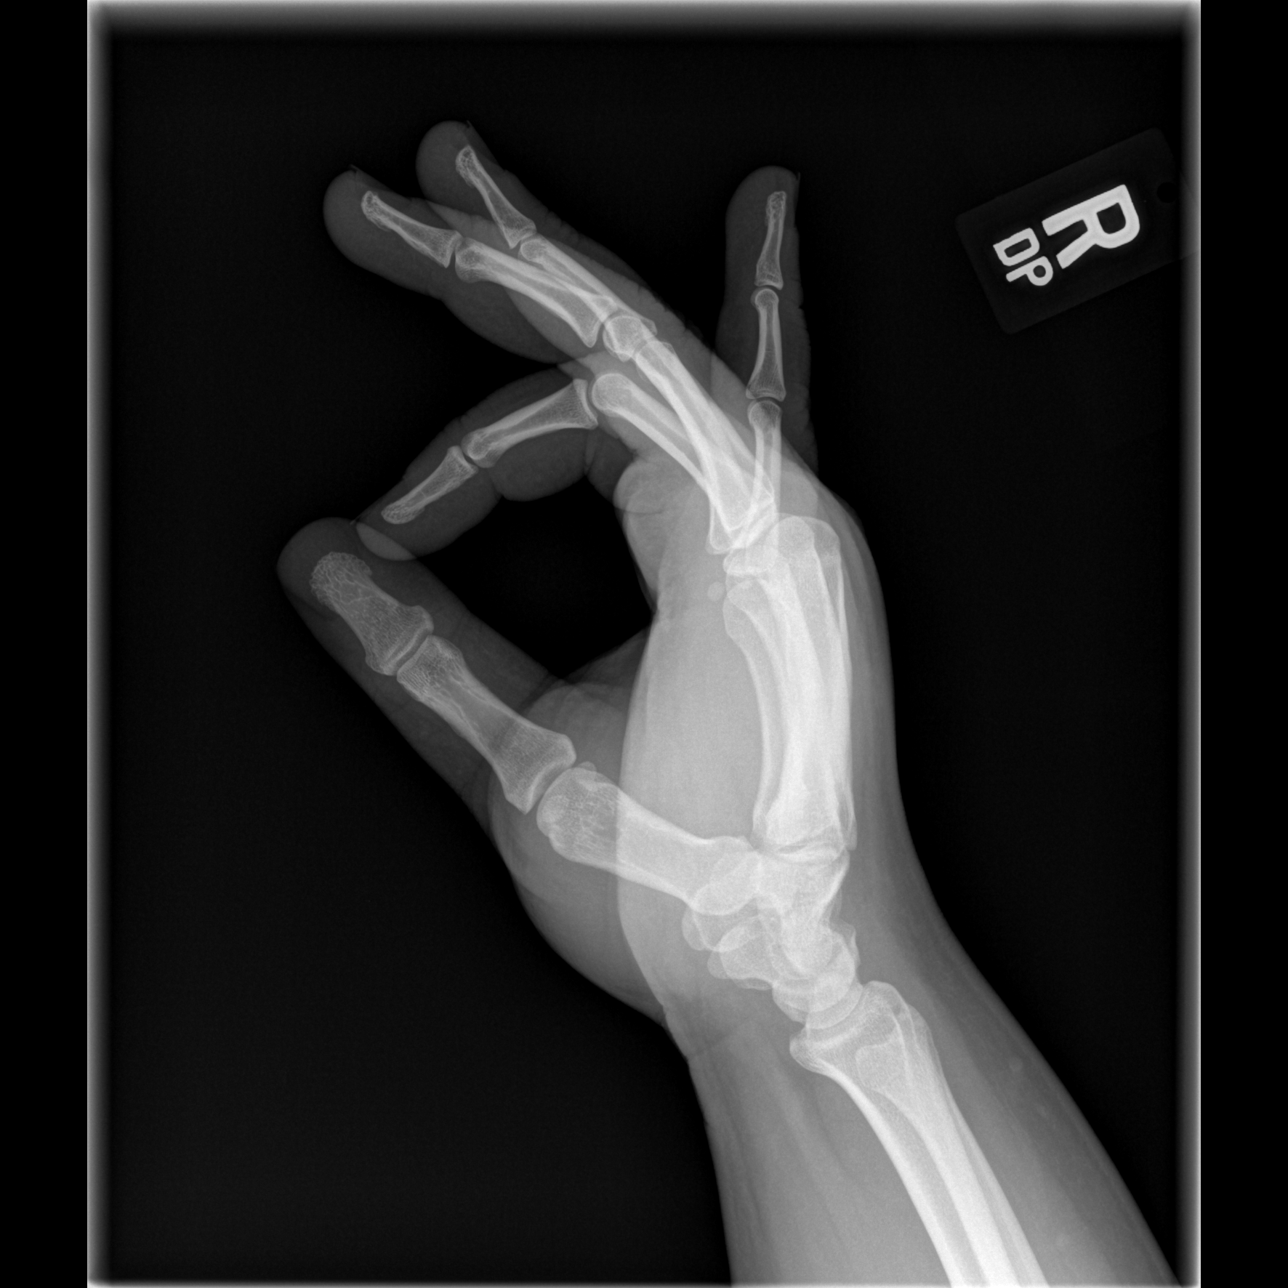

[3 of 3 positions shown; findings below may reference images not displayed]

FINDINGS: Normal bone mineralization. Joint spaces are preserved. No acute
fracture is seen. No dislocation.
IMPRESSION: Normal right hand radiographs.

## 2025-01-21 ENCOUNTER — Ambulatory Visit: Admitting: Neurology
